# Patient Record
Sex: Female | Born: 1935 | Race: White | Hispanic: No | Marital: Married | Smoking: Never smoker
Health system: Southern US, Community
[De-identification: ages and names within clinical notes are randomized; demographics above are authoritative.]

## PROBLEM LIST (undated history)

## (undated) DIAGNOSIS — I10 Essential (primary) hypertension: Secondary | ICD-10-CM

## (undated) DIAGNOSIS — T7840XA Allergy, unspecified, initial encounter: Secondary | ICD-10-CM

## (undated) DIAGNOSIS — M1990 Unspecified osteoarthritis, unspecified site: Secondary | ICD-10-CM

## (undated) DIAGNOSIS — A048 Other specified bacterial intestinal infections: Secondary | ICD-10-CM

## (undated) DIAGNOSIS — G4730 Sleep apnea, unspecified: Secondary | ICD-10-CM

## (undated) HISTORY — PX: TOTAL HIP ARTHROPLASTY: SHX124

## (undated) HISTORY — DX: Unspecified osteoarthritis, unspecified site: M19.90

## (undated) HISTORY — PX: TUBAL LIGATION: SHX77

## (undated) HISTORY — DX: Other specified bacterial intestinal infections: A04.8

## (undated) HISTORY — DX: Allergy, unspecified, initial encounter: T78.40XA

## (undated) HISTORY — DX: Sleep apnea, unspecified: G47.30

## (undated) HISTORY — DX: Essential (primary) hypertension: I10

## (undated) HISTORY — PX: OTHER SURGICAL HISTORY: SHX169

---

## 1999-02-11 ENCOUNTER — Encounter: Payer: Self-pay | Admitting: Family Medicine

## 1999-02-11 LAB — CONVERTED CEMR LAB

## 2004-10-18 ENCOUNTER — Ambulatory Visit: Payer: Self-pay | Admitting: Family Medicine

## 2005-01-18 ENCOUNTER — Ambulatory Visit: Payer: Self-pay | Admitting: Family Medicine

## 2005-01-28 ENCOUNTER — Ambulatory Visit: Payer: Self-pay | Admitting: Family Medicine

## 2005-02-21 ENCOUNTER — Ambulatory Visit: Payer: Self-pay | Admitting: Family Medicine

## 2005-03-30 ENCOUNTER — Ambulatory Visit: Payer: Self-pay | Admitting: Family Medicine

## 2005-05-05 LAB — HM COLONOSCOPY: HM Colonoscopy: NORMAL

## 2005-07-13 ENCOUNTER — Ambulatory Visit: Payer: Self-pay | Admitting: Family Medicine

## 2005-10-12 ENCOUNTER — Ambulatory Visit: Payer: Self-pay | Admitting: Family Medicine

## 2005-11-15 DIAGNOSIS — M810 Age-related osteoporosis without current pathological fracture: Secondary | ICD-10-CM | POA: Insufficient documentation

## 2005-11-15 DIAGNOSIS — B009 Herpesviral infection, unspecified: Secondary | ICD-10-CM | POA: Insufficient documentation

## 2005-11-15 DIAGNOSIS — H269 Unspecified cataract: Secondary | ICD-10-CM

## 2005-11-15 DIAGNOSIS — M1990 Unspecified osteoarthritis, unspecified site: Secondary | ICD-10-CM

## 2005-11-15 DIAGNOSIS — K5732 Diverticulitis of large intestine without perforation or abscess without bleeding: Secondary | ICD-10-CM | POA: Insufficient documentation

## 2005-11-15 DIAGNOSIS — K279 Peptic ulcer, site unspecified, unspecified as acute or chronic, without hemorrhage or perforation: Secondary | ICD-10-CM | POA: Insufficient documentation

## 2005-11-15 DIAGNOSIS — I10 Essential (primary) hypertension: Secondary | ICD-10-CM

## 2005-12-19 ENCOUNTER — Encounter: Payer: Self-pay | Admitting: Family Medicine

## 2006-01-03 ENCOUNTER — Ambulatory Visit: Payer: Self-pay | Admitting: Family Medicine

## 2006-03-31 ENCOUNTER — Encounter: Payer: Self-pay | Admitting: Family Medicine

## 2006-04-03 ENCOUNTER — Ambulatory Visit: Payer: Self-pay | Admitting: Family Medicine

## 2006-04-11 ENCOUNTER — Encounter: Payer: Self-pay | Admitting: Family Medicine

## 2006-04-11 LAB — CONVERTED CEMR LAB
Cholesterol: 223 mg/dL — ABNORMAL HIGH (ref 0–200)
LDL Cholesterol: 136 mg/dL — ABNORMAL HIGH (ref 0–99)
TSH: 1.264 microintl units/mL (ref 0.350–5.50)

## 2006-04-25 ENCOUNTER — Ambulatory Visit: Payer: Self-pay | Admitting: Family Medicine

## 2006-04-26 ENCOUNTER — Telehealth: Payer: Self-pay | Admitting: Family Medicine

## 2006-05-25 ENCOUNTER — Ambulatory Visit: Payer: Self-pay | Admitting: Family Medicine

## 2006-09-20 ENCOUNTER — Ambulatory Visit: Payer: Self-pay | Admitting: Family Medicine

## 2006-09-20 LAB — CONVERTED CEMR LAB
ALT: 15 units/L (ref 0–35)
AST: 17 units/L (ref 0–37)
Alkaline Phosphatase: 71 units/L (ref 39–117)
Calcium: 9.9 mg/dL (ref 8.4–10.5)
Potassium: 4.7 meq/L (ref 3.5–5.3)
Sodium: 137 meq/L (ref 135–145)
Total Bilirubin: 0.5 mg/dL (ref 0.3–1.2)

## 2006-09-21 ENCOUNTER — Telehealth (INDEPENDENT_AMBULATORY_CARE_PROVIDER_SITE_OTHER): Payer: Self-pay | Admitting: *Deleted

## 2007-01-02 ENCOUNTER — Telehealth: Payer: Self-pay | Admitting: Family Medicine

## 2007-01-19 ENCOUNTER — Ambulatory Visit: Payer: Self-pay | Admitting: Family Medicine

## 2007-02-22 LAB — HM MAMMOGRAPHY

## 2007-02-26 ENCOUNTER — Encounter: Payer: Self-pay | Admitting: Family Medicine

## 2007-06-04 ENCOUNTER — Ambulatory Visit: Payer: Self-pay | Admitting: Family Medicine

## 2007-06-04 LAB — CONVERTED CEMR LAB
Bilirubin Urine: NEGATIVE
Blood in Urine, dipstick: NEGATIVE
Specific Gravity, Urine: 1.015
Urobilinogen, UA: 0.2
pH: 7

## 2007-06-08 ENCOUNTER — Encounter: Payer: Self-pay | Admitting: Family Medicine

## 2007-06-08 LAB — CONVERTED CEMR LAB
Cholesterol: 209 mg/dL — ABNORMAL HIGH (ref 0–200)
HDL: 56 mg/dL (ref >39)
Total CHOL/HDL Ratio: 3.7
Triglycerides: 155 mg/dL — ABNORMAL HIGH (ref <150)
VLDL: 31 mg/dL (ref 0–40)

## 2007-06-11 ENCOUNTER — Encounter: Payer: Self-pay | Admitting: Family Medicine

## 2007-11-15 ENCOUNTER — Ambulatory Visit: Payer: Self-pay | Admitting: Family Medicine

## 2007-12-05 ENCOUNTER — Ambulatory Visit: Payer: Self-pay | Admitting: Family Medicine

## 2007-12-27 ENCOUNTER — Encounter: Payer: Self-pay | Admitting: Family Medicine

## 2007-12-27 LAB — CONVERTED CEMR LAB
BUN: 18 mg/dL
Sodium: 137 meq/L

## 2007-12-28 ENCOUNTER — Encounter: Payer: Self-pay | Admitting: Family Medicine

## 2008-01-21 ENCOUNTER — Encounter: Payer: Self-pay | Admitting: Family Medicine

## 2008-03-07 ENCOUNTER — Encounter: Admission: RE | Admit: 2008-03-07 | Discharge: 2008-03-07 | Payer: Self-pay | Admitting: Family Medicine

## 2008-03-07 ENCOUNTER — Ambulatory Visit: Payer: Self-pay | Admitting: Family Medicine

## 2008-03-07 DIAGNOSIS — I517 Cardiomegaly: Secondary | ICD-10-CM

## 2008-03-07 DIAGNOSIS — M1710 Unilateral primary osteoarthritis, unspecified knee: Secondary | ICD-10-CM

## 2008-03-13 ENCOUNTER — Encounter: Payer: Self-pay | Admitting: Family Medicine

## 2008-03-17 ENCOUNTER — Telehealth: Payer: Self-pay | Admitting: Family Medicine

## 2008-04-02 ENCOUNTER — Telehealth (INDEPENDENT_AMBULATORY_CARE_PROVIDER_SITE_OTHER): Payer: Self-pay | Admitting: *Deleted

## 2008-04-10 ENCOUNTER — Encounter: Payer: Self-pay | Admitting: Family Medicine

## 2008-04-28 ENCOUNTER — Ambulatory Visit: Payer: Self-pay | Admitting: Family Medicine

## 2008-04-28 DIAGNOSIS — B029 Zoster without complications: Secondary | ICD-10-CM | POA: Insufficient documentation

## 2008-05-22 ENCOUNTER — Encounter: Payer: Self-pay | Admitting: Family Medicine

## 2008-07-04 ENCOUNTER — Ambulatory Visit: Payer: Self-pay | Admitting: Family Medicine

## 2008-07-05 ENCOUNTER — Encounter: Payer: Self-pay | Admitting: Family Medicine

## 2008-07-09 ENCOUNTER — Telehealth: Payer: Self-pay | Admitting: Family Medicine

## 2008-07-09 LAB — CONVERTED CEMR LAB
AST: 15 units/L (ref 0–37)
Alkaline Phosphatase: 71 units/L (ref 39–117)
BUN: 12 mg/dL (ref 6–23)
CO2: 26 meq/L (ref 19–32)
Calcium: 9.8 mg/dL (ref 8.4–10.5)
Creatinine, Ser: 0.7 mg/dL (ref 0.40–1.20)
Glucose, Bld: 85 mg/dL (ref 70–99)
HDL: 60 mg/dL (ref >39)
Hemoglobin: 14.9 g/dL (ref 12.0–15.0)
LDL Cholesterol: 122 mg/dL — ABNORMAL HIGH (ref 0–99)
Platelets: 296 10*3/uL (ref 150–400)
Potassium: 4.4 meq/L (ref 3.5–5.3)
Total Bilirubin: 0.6 mg/dL (ref 0.3–1.2)
Total CHOL/HDL Ratio: 3.4
VLDL: 23 mg/dL (ref 0–40)
WBC: 9.7 10*3/uL (ref 4.0–10.5)

## 2008-07-15 ENCOUNTER — Encounter: Payer: Self-pay | Admitting: Family Medicine

## 2008-07-28 ENCOUNTER — Ambulatory Visit: Payer: Self-pay | Admitting: Family Medicine

## 2008-08-06 ENCOUNTER — Telehealth: Payer: Self-pay | Admitting: Family Medicine

## 2008-08-26 ENCOUNTER — Encounter: Payer: Self-pay | Admitting: Family Medicine

## 2008-10-02 ENCOUNTER — Ambulatory Visit: Payer: Self-pay | Admitting: Family Medicine

## 2008-10-02 DIAGNOSIS — J309 Allergic rhinitis, unspecified: Secondary | ICD-10-CM | POA: Insufficient documentation

## 2008-11-12 ENCOUNTER — Telehealth: Payer: Self-pay | Admitting: Family Medicine

## 2008-12-03 ENCOUNTER — Ambulatory Visit: Payer: Self-pay | Admitting: Family Medicine

## 2008-12-03 DIAGNOSIS — K219 Gastro-esophageal reflux disease without esophagitis: Secondary | ICD-10-CM | POA: Insufficient documentation

## 2009-01-20 ENCOUNTER — Encounter: Payer: Self-pay | Admitting: Family Medicine

## 2009-01-20 LAB — CONVERTED CEMR LAB: Hgb A1c MFr Bld: 5.6 %

## 2009-02-23 ENCOUNTER — Telehealth: Payer: Self-pay | Admitting: Family Medicine

## 2009-02-23 DIAGNOSIS — M722 Plantar fascial fibromatosis: Secondary | ICD-10-CM | POA: Insufficient documentation

## 2009-03-05 ENCOUNTER — Encounter
Admission: RE | Admit: 2009-03-05 | Discharge: 2009-06-03 | Payer: Self-pay | Source: Home / Self Care | Admitting: Family Medicine

## 2009-03-05 ENCOUNTER — Encounter: Payer: Self-pay | Admitting: Family Medicine

## 2009-03-23 ENCOUNTER — Encounter: Payer: Self-pay | Admitting: Family Medicine

## 2009-04-06 ENCOUNTER — Ambulatory Visit: Payer: Self-pay | Admitting: Family Medicine

## 2009-04-16 ENCOUNTER — Ambulatory Visit: Payer: Self-pay | Admitting: Family Medicine

## 2009-04-16 ENCOUNTER — Encounter: Admission: RE | Admit: 2009-04-16 | Discharge: 2009-04-16 | Payer: Self-pay | Admitting: Family Medicine

## 2009-04-16 DIAGNOSIS — M79609 Pain in unspecified limb: Secondary | ICD-10-CM | POA: Insufficient documentation

## 2009-04-17 ENCOUNTER — Telehealth (INDEPENDENT_AMBULATORY_CARE_PROVIDER_SITE_OTHER): Payer: Self-pay | Admitting: *Deleted

## 2009-05-01 ENCOUNTER — Telehealth (INDEPENDENT_AMBULATORY_CARE_PROVIDER_SITE_OTHER): Payer: Self-pay | Admitting: *Deleted

## 2009-05-15 ENCOUNTER — Encounter: Payer: Self-pay | Admitting: Family Medicine

## 2009-08-12 ENCOUNTER — Ambulatory Visit: Payer: Self-pay | Admitting: Family Medicine

## 2009-08-12 DIAGNOSIS — R51 Headache: Secondary | ICD-10-CM

## 2009-08-12 DIAGNOSIS — R519 Headache, unspecified: Secondary | ICD-10-CM | POA: Insufficient documentation

## 2009-08-12 DIAGNOSIS — G43109 Migraine with aura, not intractable, without status migrainosus: Secondary | ICD-10-CM | POA: Insufficient documentation

## 2009-08-14 ENCOUNTER — Encounter: Payer: Self-pay | Admitting: Family Medicine

## 2009-08-17 LAB — CONVERTED CEMR LAB
Albumin: 4.6 g/dL (ref 3.5–5.2)
Alkaline Phosphatase: 78 units/L (ref 39–117)
Basophils Absolute: 0.1 10*3/uL (ref 0.0–0.1)
Basophils Relative: 1 % (ref 0–1)
CO2: 25 meq/L (ref 19–32)
Calcium: 9.4 mg/dL (ref 8.4–10.5)
Chloride: 102 meq/L (ref 96–112)
Eosinophils Absolute: 0.1 10*3/uL (ref 0.0–0.7)
Eosinophils Relative: 1 % (ref 0–5)
HDL: 58 mg/dL (ref >39)
Hemoglobin: 14.7 g/dL (ref 12.0–15.0)
LDL Cholesterol: 111 mg/dL — ABNORMAL HIGH (ref 0–99)
Monocytes Relative: 9 % (ref 3–12)
Neutro Abs: 4.6 10*3/uL (ref 1.7–7.7)
Neutrophils Relative %: 53 % (ref 43–77)
Potassium: 4.6 meq/L (ref 3.5–5.3)
RBC: 5.03 M/uL (ref 3.87–5.11)

## 2009-08-20 ENCOUNTER — Encounter: Payer: Self-pay | Admitting: Family Medicine

## 2009-08-21 ENCOUNTER — Telehealth: Payer: Self-pay | Admitting: Family Medicine

## 2009-08-23 ENCOUNTER — Telehealth: Payer: Self-pay | Admitting: Family Medicine

## 2009-09-07 ENCOUNTER — Ambulatory Visit: Payer: Self-pay | Admitting: Family Medicine

## 2009-09-07 DIAGNOSIS — D4959 Neoplasm of unspecified behavior of other genitourinary organ: Secondary | ICD-10-CM

## 2009-12-01 ENCOUNTER — Ambulatory Visit: Payer: Self-pay | Admitting: Family Medicine

## 2010-01-07 ENCOUNTER — Encounter: Payer: Self-pay | Admitting: Family Medicine

## 2010-01-12 ENCOUNTER — Ambulatory Visit: Payer: Self-pay | Admitting: Family Medicine

## 2010-03-09 NOTE — Assessment & Plan Note (Signed)
Summary: DJD R knee   Vital Signs:  Patient profile:   77 year old female Height:      58.5 inches Weight:      139 pounds BMI:     28.66 O2 Sat:      98 % on Room air Pulse rate:   87 / minute BP sitting:   122 / 72  (left arm) Cuff size:   regular  Vitals Entered By: Payton Spark CMA (April 16, 2009 1:47 PM)  O2 Flow:  Room air CC: R knee pain x 5 days. No known injury.  Pain Assessment Patient in pain? yes     Location: R knee Intensity: 10   Primary Care Provider:  Seymour Bars DO  CC:  R knee pain x 5 days. No known injury. Marland Kitchen  History of Present Illness: Ms. Storey is a 77 year old woman with h/o osteoarthritis presenting with R knee pain. On Saturday she was sitting down in a chair and when she stood up she felt her knee "lock." She did not fall but had intense aching pain on the posterior aspect of her knee that radiated to her bilateral posterior calf muscles. The pain improved over the weekend with icyhot, advil, ibuprofen, and rest. Last night when she was getting up from the table the same thing happened again. She is now in a lot of pain and is having trouble walking. It is not tender to the touch but is worse with movement. No swelling, bruising, redness.   She was scheduled for L knee replacement surgery in January but  rescheduled due to plantar fasciitis in her R foot. She finished PT a few weeks ago and had not had any problems.     Allergies: 1)  ! Keflex 2)  Miacalcin  Past History:  Past Medical History: Reviewed history from 04/06/2009 and no changes required. [id number]- NSVD  hay fever Spring/Fall  hx H. Pylori- treated mild sleep apnea- no mask prescribed HTN knee DJD  Social History: Reviewed history from 11/15/2005 and no changes required. 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  Has 2 children; son in [place], daughter in [place].  Non-smoker.  Does low-impact aerobics  Review of Systems      See  HPI  Physical Exam  General:  Pleasant female in no acute distress.  Head:  Normocephalic and atraumatic.  Eyes:  Sclera clear.  Nose:  No rhinorrhea.  Mouth:  Oropharynx clear with no lesions or exudate.  Neck:  Supple.  Lungs:  Clear to auscultation bilaterally. Normal work of breathing.  Heart:  Regular rate and rhythm with normal S1 and S2. No murmur, rub, or gallop.  Pulses:  2+ radial and dorsalis pedis pulses bilaterally. Normal capillary refill.  Neurologic:  Sensation intact bilaterally.  Psych:  Interacting appropriately with normal concentration and attention.    Knee Exam  General:    obese.    Gait:    limp noted-right.    Skin:    Intact, no scars, lesions, rashes, cafe au lait spots, or bruising.    Inspection:     No deformity, ecchymosis or swelling.   Palpation:    Non-tender to palpation over medial joint line, lateral joint line, parapatellar, condylar, patellar tendon, or Pes bursa.   Knee Exam:    Right:    Inspection:  Normal       Location:  R bakers cyst    Stability:  stable  Tenderness:  no    Swelling:  no    Erythema:  no    Nontender to palpation, pain with movement. Popliteal pain radiating to bilateral dorsiflexors with knee flexion and ankle dorsiflexion.     Range of Motion:       Flexion-Active: full       Extension-Active: full       Flexion-Passive: full       Extension-Passive: full  Special tests:    McMurray negative Varus/Valgus negative   Patellofemoral joint crepitus:    Right negative Lachman :    Right negative MCL:    Right negative LCL:    Right negative   Impression & Recommendations:  Problem # 1:  DEGENERATIVE JOINT DISEASE, KNEE (ICD-715.96) Xray today confirms severe DJD in the R knee (also on the L side).  Treat with RX Etodolac for pain and inflammation.  Has a walker for her limp.  D Dimer neg today indicating no DVT (given popliteal pain).  Use ACE wrap and ice and set up with ortho to discuss  steroid injection and knee replacement.    Orders: Ace Wraps 3-5 in/yard  (E4540) Orthopedic Referral (Ortho)  Her updated medication list for this problem includes:    Aspir-low 81 Mg Tbec (Aspirin) .Marland Kitchen... 1 tab daily    Etodolac 400 Mg Tabs (Etodolac) .Marland Kitchen... 1 tab by mouth two times a day with food for knee pain  Complete Medication List: 1)  Aspir-low 81 Mg Tbec (Aspirin) .Marland Kitchen.. 1 tab daily 2)  Calcarb 600/d 600-125 Mg-unit Tabs (Calcium-vitamin d) .Marland Kitchen.. 1 tab two times a day with food 3)  Amlodipine Besy-benazepril Hcl 5-20 Mg Caps (Amlodipine besy-benazepril hcl) .Marland Kitchen.. 1 tab by mouth daily 4)  Multivitamins Tabs (Multiple vitamin) .Marland Kitchen.. 1 tab by mouth daily 5)  Alprazolam 0.25 Mg Tabs (Alprazolam) .Marland Kitchen.. 1 tab by mouth once daily as needed for anxiety 6)  Zantac 150 Mg Caps (Ranitidine hcl) .... Take 1 tablet by mouth once a day 7)  Etodolac 400 Mg Tabs (Etodolac) .Marland Kitchen.. 1 tab by mouth two times a day with food for knee pain  Other Orders: T-D-Dimer Fibrin Derivatives Quantitive ([id number]) T-DG Knee 2 Views*R* ([id number])  Patient Instructions: 1)  Wear ACE wrap and use ice 15 min 4 x a day for comfort. 2)  Use Ibuprofen 600 mg with meals (3 x a day) for pain and swelling. 3)  Blood test today. 4)  Xray today. 5)  Will call you w/ results in the morning. Prescriptions: ETODOLAC 400 MG TABS (ETODOLAC) 1 tab by mouth two times a day with food for knee pain  #24 x 0   Entered and Authorized by:   Seymour Bars DO   Signed by:   Seymour Bars DO on 04/16/2009   Method used:   Electronically to        [facility] [phone]* (retail)       [address] [place], [place]  [address]       Ph: [phone]       Fax: [phone]   RxID:   [id number]

## 2010-03-09 NOTE — Letter (Signed)
Summary: [facility]  [facility]   Imported By: Lanelle Bal 06/25/2009 07:48:49  _____________________________________________________________________  External Attachment:    Type:   Image     Comment:   External Document

## 2010-03-09 NOTE — Assessment & Plan Note (Signed)
Summary: f/u BP   Vital Signs:  Patient profile:   77 year old female Height:      58.5 inches Weight:      135 pounds BMI:     27.84 O2 Sat:      97 % on Room air Pulse rate:   73 / minute BP sitting:   145 / 81  (left arm) Cuff size:   regular  Vitals Entered By: Payton Spark CMA (January 12, 2010 2:06 PM)  O2 Flow:  Room air CC: F/U.    Primary Care Provider:  Seymour Bars DO  CC:  F/U. Marland Kitchen  History of Present Illness: 77 yo WF presents for f/u HTN.  Doing well but she is either forgetting or cutting in half her Atenolol RX.  She has felt a little lightheaded in the afternoons but is not checking her BPs.  eAting well.  Denies CP or DOE.  Denies palpitations or leg swelling.  Her labs are UTD.      Allergies: 1)  ! Keflex 2)  Miacalcin  Past History:  Past Medical History: Reviewed history from 09/07/2009 and no changes required. [id number]- NSVD  hay fever Spring/Fall  hx H. Pylori- treated mild sleep apnea- no mask prescribed HTN knee DJD-- Dr Jorge Mandril  Social History: Reviewed history from 11/15/2005 and no changes required. 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  Has 2 children; son in [place], daughter in [place].  Non-smoker.  Does low-impact aerobics  Review of Systems      See HPI  Physical Exam  General:  alert, well-developed, well-nourished, well-hydrated, and overweight-appearing.   Head:  normocephalic and atraumatic.   Eyes:  pupils equal, pupils round, and pupils reactive to light.   Mouth:  pharynx pink and moist.   Neck:  no masses.  no audible carotid bruits Lungs:  Normal respiratory effort, chest expands symmetrically. Lungs are clear to auscultation, no crackles or wheezes. Heart:  Normal rate and regular rhythm. S1 and S2 normal without gallop, murmur, click, rub or other extra sounds. Pulses:  2+ radial pulses Extremities:  no LE edema Skin:  color normal.   Psych:  good eye contact, not anxious  appearing, and not depressed appearing.     Impression & Recommendations:  Problem # 1:  HYPERTENSION, BENIGN SYSTEMIC (ICD-401.1) BP high today.  Will increase Atenolol from 25--> 50 mg once daily.  Labs UTD. Her updated medication list for this problem includes:    Amlodipine Besy-benazepril Hcl 5-20 Mg Caps (Amlodipine besy-benazepril hcl) .Marland Kitchen... 1 tab by mouth daily    Atenolol 50 Mg Tabs (Atenolol) .Marland Kitchen... 1 tab by mouth daily  BP today: 145/81 Prior BP: 139/80 (09/07/2009)  Prior 10 Yr Risk Heart Disease: 7 % (07/28/2008)  Labs Reviewed: K+: 4.6 (08/14/2009) Creat: : 0.60 (08/14/2009)   Chol: 198 (08/14/2009)   HDL: 58 (08/14/2009)   LDL: 111 (08/14/2009)   TG: 147 (08/14/2009)  Complete Medication List: 1)  Aspir-low 81 Mg Tbec (Aspirin) .Marland Kitchen.. 1 tab daily 2)  Calcarb 600/d 600-125 Mg-unit Tabs (Calcium-vitamin d) .Marland Kitchen.. 1 tab two times a day with food 3)  Amlodipine Besy-benazepril Hcl 5-20 Mg Caps (Amlodipine besy-benazepril hcl) .Marland Kitchen.. 1 tab by mouth daily 4)  Multivitamins Tabs (Multiple vitamin) .Marland Kitchen.. 1 tab by mouth daily 5)  Alprazolam 0.25 Mg Tabs (Alprazolam) .Marland Kitchen.. 1 tab by mouth once daily as needed for anxiety 6)  Zantac 150 Mg Caps (Ranitidine hcl) .... Take 1  tablet by mouth once a day as needed. 7)  Atenolol 50 Mg Tabs (Atenolol) .Marland Kitchen.. 1 tab by mouth daily  Patient Instructions: 1)  Increase Atenolol to 50 mg once daily. 2)  Monitor BPs at home.   3)  If < 100/70, go ahead and cut them in half and take 1/2 tab once daily. 4)  Call if any problems. 5)  REturn for follow up in 6 mos. Prescriptions: ATENOLOL 50 MG TABS (ATENOLOL) 1 tab by mouth daily  #30 x 3   Entered and Authorized by:   Seymour Bars DO   Signed by:   Seymour Bars DO on 01/12/2010   Method used:   Electronically to        [facility] [phone]* (retail)       [address] [place], [place]  [address]       Ph: [phone]       Fax: [phone]   RxID:   [id number]    Orders Added: 1)   Est. Patient Level III [24401]

## 2010-03-09 NOTE — Progress Notes (Signed)
Summary: MRI brain normal  Phone Note Outgoing Call   Summary of Call: Pls let pt know that her brain MRI came back normal.   Initial call taken by: Seymour Bars DO,  August 21, 2009 3:44 PM     Appended Document: MRI brain normal Pt aware of the above

## 2010-03-09 NOTE — Letter (Signed)
Summary: Annual Exam/[facility]  Annual Exam/[facility]   Imported By: Maryln Gottron 01/15/2010 10:36:13  _____________________________________________________________________  External Attachment:    Type:   Image     Comment:   External Document

## 2010-03-09 NOTE — Assessment & Plan Note (Signed)
Summary: f/u HTN   Vital Signs:  Patient profile:   77 year old female Height:      58.5 inches Weight:      139 pounds BMI:     28.66 O2 Sat:      99 % on Room air Temp:     98.3 degrees F oral Pulse rate:   83 / minute BP sitting:   122 / 73  (left arm) Cuff size:   regular  Vitals Entered By: Payton Spark CMA (April 06, 2009 1:48 PM)  O2 Flow:  Room air CC: F/U   Primary Care Provider:  Seymour Bars DO  CC:  F/U.  History of Present Illness: Shelly Page presents for f/u HTN and esophageal reflux.  Last visit 4 mos ago, she was put on Prevacid for 4 wks for throat irritation which has since improved.  She backed down to Zantac which she is just taking as needed now.    Her BP has been well controlled with Amlodopine- Benazepril.  She is doing well on it.  She is limited with exercise due to deconditioning.  She was scheduled for a TKR but then had plantar fasciitis.  This has improved.  She is looking into options other than TKR.  Her L knee is worse than the R.  This limits her ability to walk much and exercise.  She denies the need for a cane.     Current Medications (verified): 1)  Aspir-Low 81 Mg Tbec (Aspirin) .Marland Kitchen.. 1 Tab Daily 2)  Calcarb 600/d 600-125 Mg-Unit Tabs (Calcium-Vitamin D) .Marland Kitchen.. 1 Tab Two Times A Day With Food 3)  Amlodipine Besy-Benazepril Hcl 5-20 Mg Caps (Amlodipine Besy-Benazepril Hcl) .Marland Kitchen.. 1 Tab By Mouth Daily 4)  Multivitamins  Tabs (Multiple Vitamin) .Marland Kitchen.. 1 Tab By Mouth Daily 5)  Alprazolam 0.25 Mg  Tabs (Alprazolam) .Marland Kitchen.. 1 Tab By Mouth Once Daily As Needed For Anxiety 6)  Zantac 150 Mg Caps (Ranitidine Hcl) .... Take 1 Tablet By Mouth Once A Day  Allergies (verified): 1)  ! Keflex 2)  Miacalcin  Past History:  Past Medical History: G2P2002- NSVD  hay fever Spring/Fall  hx H. Pylori- treated mild sleep apnea- no mask prescribed HTN knee DJD  Past Surgical History: Reviewed history from 03/07/2008 and no changes required. R hip  replacement  Tubal ligation cataract surgery Right/ Left  Social History: Reviewed history from 11/15/2005 and no changes required. 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  Has 2 children; son in [place], daughter in [place].  Non-smoker.  Does low-impact aerobics  Review of Systems      See HPI  Physical Exam  General:  alert and well-developed.  overwt Head:  normocephalic and atraumatic.   Eyes:  pupils equal, pupils round, and pupils reactive to light.  wearing glasses Nose:  no nasal discharge.   Mouth:  pharynx pink and moist.   Neck:  no masses.   Lungs:  Normal respiratory effort, chest expands symmetrically. Lungs are clear to auscultation, no crackles or wheezes. Heart:  Normal rate and regular rhythm. S1 and S2 normal without gallop, murmur, click, rub or other extra sounds. Msk:  slight L>R knee effusion w/o injection Extremities:  no LE edema Skin:  color normal.   Psych:  good eye contact, not anxious appearing, and not depressed appearing.     Impression & Recommendations:  Problem # 1:  HYPERTENSION, BENIGN SYSTEMIC (ICD-401.1) BP looks great.  F/U  in 4 mos and will update labs then.   Her updated medication list for this problem includes:    Amlodipine Besy-benazepril Hcl 5-20 Mg Caps (Amlodipine besy-benazepril hcl) .Marland Kitchen... 1 tab by mouth daily  BP today: 122/73 Prior BP: 126/75 (12/03/2008)  Prior 10 Yr Risk Heart Disease: 7 % (07/28/2008)  Labs Reviewed: K+: 4.4 (07/05/2008) Creat: : 0.70 (07/05/2008)   Chol: 205 (07/05/2008)   HDL: 60 (07/05/2008)   LDL: 122 (07/05/2008)   TG: 115 (07/05/2008)  Problem # 2:  ESOPHAGEAL REFLUX (ICD-530.81) Assessment: Improved On Zantac as needed now.   Her updated medication list for this problem includes:    Zantac 150 Mg Caps (Ranitidine hcl) .Marland Kitchen... Take 1 tablet by mouth once a day  Problem # 3:  DEGENERATIVE JOINT DISEASE, LEFT KNEE (ICD-715.96) Assessment: Unchanged She has an  orthopedist and has declined steroid injection, synvisc etc.  Was told that TKR was needed to relieve her pain but she is reluctant to proceed.  Declined RX NSAIDs.  We discussed the importance of needing to stay physically active for medical and physical reasons. Her updated medication list for this problem includes:    Aspir-low 81 Mg Tbec (Aspirin) .Marland Kitchen... 1 tab daily  Complete Medication List: 1)  Aspir-low 81 Mg Tbec (Aspirin) .Marland Kitchen.. 1 tab daily 2)  Calcarb 600/d 600-125 Mg-unit Tabs (Calcium-vitamin d) .Marland Kitchen.. 1 tab two times a day with food 3)  Amlodipine Besy-benazepril Hcl 5-20 Mg Caps (Amlodipine besy-benazepril hcl) .Marland Kitchen.. 1 tab by mouth daily 4)  Multivitamins Tabs (Multiple vitamin) .Marland Kitchen.. 1 tab by mouth daily 5)  Alprazolam 0.25 Mg Tabs (Alprazolam) .Marland Kitchen.. 1 tab by mouth once daily as needed for anxiety 6)  Zantac 150 Mg Caps (Ranitidine hcl) .... Take 1 tablet by mouth once a day  Patient Instructions: 1)  Follow up in 4 months for HTN. Prescriptions: AMLODIPINE BESY-BENAZEPRIL HCL 5-20 MG CAPS (AMLODIPINE BESY-BENAZEPRIL HCL) 1 tab by mouth daily  #90 x 2   Entered and Authorized by:   Seymour Bars DO   Signed by:   Seymour Bars DO on 04/06/2009   Method used:   Electronically to        [facility] [phone]* (retail)       [address] [place], [place]  [address]       Ph: [phone]       Fax: [phone]   RxID:   [id number]

## 2010-03-09 NOTE — Assessment & Plan Note (Signed)
Summary: HA f/u   Vital Signs:  Patient profile:   77 year old female Height:      58.5 inches Weight:      137 pounds BMI:     28.25 O2 Sat:      99 % on Room air Pulse rate:   75 / minute BP sitting:   139 / 80  (left arm) Cuff size:   regular  Vitals Entered By: Payton Spark CMA (September 07, 2009 9:43 AM)  O2 Flow:  Room air CC: F/U HA. Doing well. Also c/o lump on vagina x 3 months.    Primary Care Provider:  Seymour Bars DO  CC:  F/U HA. Doing well. Also c/o lump on vagina x 3 months. .  History of Present Illness: 77 yo WF presents for f/u headaches.  She had a workup including labs, MRI brain and carotid dopplers which were all normal.  She has not had a HA in the past 3 wks.  She had her eyes examined in early July.  She thinks it was from being overheated.    She has noticed a lump on her L labia that she first noticed in May when taking a shower.  It does not hurt.  Denies any bleeding or discharge.  It is not changing in size.      Current Medications (verified): 1)  Aspir-Low 81 Mg Tbec (Aspirin) .Marland Kitchen.. 1 Tab Daily 2)  Calcarb 600/d 600-125 Mg-Unit Tabs (Calcium-Vitamin D) .Marland Kitchen.. 1 Tab Two Times A Day With Food 3)  Amlodipine Besy-Benazepril Hcl 5-20 Mg Caps (Amlodipine Besy-Benazepril Hcl) .Marland Kitchen.. 1 Tab By Mouth Daily 4)  Multivitamins  Tabs (Multiple Vitamin) .Marland Kitchen.. 1 Tab By Mouth Daily 5)  Alprazolam 0.25 Mg  Tabs (Alprazolam) .Marland Kitchen.. 1 Tab By Mouth Once Daily As Needed For Anxiety 6)  Zantac 150 Mg Caps (Ranitidine Hcl) .... Take 1 Tablet By Mouth Once A Day As Needed. 7)  Advil 200 Mg Tabs (Ibuprofen) .... As Needed For Headache 8)  Atenolol 25 Mg Tabs (Atenolol) .Marland Kitchen.. 1 Tab By Mouth Daily  Allergies (verified): 1)  ! Keflex 2)  Miacalcin  Past History:  Past Medical History: G2P2002- NSVD  hay fever Spring/Fall  hx H. Pylori- treated mild sleep apnea- no mask prescribed HTN knee DJD-- Dr Jorge Mandril  Past Surgical History: Reviewed history from 03/07/2008 and no  changes required. R hip replacement  Tubal ligation cataract surgery Right/ Left  Social History: Reviewed history from 11/15/2005 and no changes required. 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  Has 2 children; son in [place], daughter in [place].  Non-smoker.  Does low-impact aerobics  Review of Systems      See HPI  Physical Exam  General:  alert, well-developed, well-nourished, well-hydrated, and overweight-appearing.   Head:  normocephalic and atraumatic.   Eyes:  pupils equal, pupils round, and pupils reactive to light.   Mouth:  pharynx pink and moist.   Neck:  no masses.   Lungs:  Normal respiratory effort, chest expands symmetrically. Lungs are clear to auscultation, no crackles or wheezes. Heart:  Regular rate and rhythm with normal S1 and S2. No murmur, rub, or gallop.  Genitalia:  normal introitus, no external lesions, no vaginal discharge, and mucosa pink and moist.   Skin:  color normal.   Inguinal Nodes:  No significant adenopathy Psych:  good eye contact, not anxious appearing, and not depressed appearing.     Impression &  Recommendations:  Problem # 1:  CEPHALGIA (ICD-784.0) Workup done for atypical HAs.  She had normal labs, a normal carotid doppler u/s and a normal MRI of the brain.  This may have been from vision changes, which she had checked last month.  She has stopped having frequent HAs and her BPs have been normal.  They were likely caused by dehydration.  She has been trying to drink more fluids and stay out of the heat. Her updated medication list for this problem includes:    Aspir-low 81 Mg Tbec (Aspirin) .Marland Kitchen... 1 tab daily    Advil 200 Mg Tabs (Ibuprofen) .Marland Kitchen... As needed for headache    Atenolol 25 Mg Tabs (Atenolol) .Marland Kitchen... 1 tab by mouth daily  Problem # 2:  NEOPLASM UNCERTAIN BHV OTH&UNSPEC FE GENIT ORGN (ICD-236.3) I was unable to palpate a labial lesion which she had felt back in May.  No visible skin changes and  no inguinal lymphadenopathy.  REassured pt and asked her to call if any changes.  Complete Medication List: 1)  Aspir-low 81 Mg Tbec (Aspirin) .Marland Kitchen.. 1 tab daily 2)  Calcarb 600/d 600-125 Mg-unit Tabs (Calcium-vitamin d) .Marland Kitchen.. 1 tab two times a day with food 3)  Amlodipine Besy-benazepril Hcl 5-20 Mg Caps (Amlodipine besy-benazepril hcl) .Marland Kitchen.. 1 tab by mouth daily 4)  Multivitamins Tabs (Multiple vitamin) .Marland Kitchen.. 1 tab by mouth daily 5)  Alprazolam 0.25 Mg Tabs (Alprazolam) .Marland Kitchen.. 1 tab by mouth once daily as needed for anxiety 6)  Zantac 150 Mg Caps (Ranitidine hcl) .... Take 1 tablet by mouth once a day as needed. 7)  Advil 200 Mg Tabs (Ibuprofen) .... As needed for headache 8)  Atenolol 25 Mg Tabs (Atenolol) .Marland Kitchen.. 1 tab by mouth daily  Patient Instructions: 1)  Call if anything changes on labia. 2)  BP looks great. 3)  Caroltids, labs and MRI brain all normal. 4)  Make sure you stay hydrated (urine should be pale yellow). 5)  Return for follow up in 4 mos.

## 2010-03-09 NOTE — Progress Notes (Signed)
Summary: CALL A NURSE  Phone Note From Other Clinic   Summary of Call: [facility] Triage Call Report Triage Record Num: [id number] Operator: Craig Guess Patient Name: Shelly Page Call Date & Time: 04/16/2009 5:42:41PM Patient Phone: [phone] PCP: Seymour Bars, DO Patient Gender: Female PCP Fax : [phone] Patient DOB: 02/28/1932 Practice Name: [facility] Reason for Call: Delaney Meigs calling from [facility] [phone] with stat result. D-Dimer Normal at 0.34. Note to office for Friday 3/11 am review per Practice Profile. Protocol(s) Used: PCP Calls, No Triage (Adult) Recommended Outcome per Protocol: Call Provider within 24 Hours Reason for Outcome: Lab calling with test results Care Advice: Initial call taken by: Payton Spark CMA,  April 17, 2009 9:02 AM

## 2010-03-09 NOTE — Assessment & Plan Note (Signed)
Summary: new onset migraines   Vital Signs:  Patient profile:   77 year old female Height:      58.5 inches Weight:      136 pounds BMI:     28.04 Temp:     98.0 degrees F oral Pulse rate:   86 / minute Pulse rhythm:   regular Resp:     16 per minute BP sitting:   145 / 84  (right arm) Cuff size:   regular  Vitals Entered By: Mervin Kung CMA Duncan Dull) (August 12, 2009 1:36 PM) CC: Room 1  4 month follow up. Pt has been having 1 headache accompanied by flashes of light each week. Feels better after resting. Eye doctor told her her vision is good and  may be having migraines? Is Patient Diabetic? No   Primary Care Provider:  Seymour Bars DO  CC:  Room 1  4 month follow up. Pt has been having 1 headache accompanied by flashes of light each week. Feels better after resting. Eye doctor told her her vision is good and  may be having migraines?.  History of Present Illness: 77 yo WF presents for f/u HTN.  She has started having HAs over the R eye with 'squiggly' lines in the R eye when she got overheated.  She had her vision checked 2 wks ago and it was normal.  Her HAs go away with rest.  The HAs last about an hr.  Sometimes takes Advil and it goes away.  Her BPs are running 120-130s / 70s to 80s at home.  Denies any CP or SOB.  Her HAs are always on the R side.    She never had migraines.  Denies feeling confused.  Denies any N/V.  She has distorted vision and photophobia with it.  Denies any tingling, numbness or confusion.    Allergies: 1)  ! Keflex 2)  Miacalcin  Past History:  Past Medical History: Reviewed history from 04/06/2009 and no changes required. [id number]- NSVD  hay fever Spring/Fall  hx H. Pylori- treated mild sleep apnea- no mask prescribed HTN knee DJD  Past Surgical History: Reviewed history from 03/07/2008 and no changes required. R hip replacement  Tubal ligation cataract surgery Right/ Left  Social History: Reviewed history from 11/15/2005 and no  changes required. 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  Has 2 children; son in [place], daughter in [place].  Non-smoker.  Does low-impact aerobics  Review of Systems General:  Denies fatigue, sleep disorder, sweats, and weight loss. Eyes:  R eye scotomata just prior to onset of severe R sided HA. ENT:  Denies difficulty swallowing. CV:  Denies chest pain or discomfort, palpitations, shortness of breath with exertion, and swelling of feet. Resp:  Denies shortness of breath. GI:  Denies loss of appetite, nausea, and vomiting. GU:  Denies incontinence. MS:  Denies muscle weakness. Neuro:  Complains of headaches; denies falling down, inability to speak, memory loss, numbness, poor balance, and tingling.  Physical Exam  General:  alert, well-developed, well-nourished, well-hydrated, and overweight-appearing.   Head:  normocephalic and atraumatic.  no temporal artery bruits Eyes:  s/p bilat cataract surgery.  PERRLA.  EOMI.  grossly normal fundoscopic exam Mouth:  pharynx pink and moist.   Neck:  no masses.  no audible carotid bruits Lungs:  Normal respiratory effort, chest expands symmetrically. Lungs are clear to auscultation, no crackles or wheezes. Heart:  Regular rate and rhythm with normal S1  and S2. No murmur, rub, or gallop.  Msk:  grip + 5/5 bilat  Pulses:  2+ radial pulses Extremities:  no LE edema Neurologic:  strength normal in all extremities and gait normal.   Skin:  color normal.   Cervical Nodes:  No lymphadenopathy noted Psych:  memory intact for recent and remote, good eye contact, not anxious appearing, and not depressed appearing.     Impression & Recommendations:  Problem # 1:  MIGRAINE WITH AURA (ICD-346.00) Assessment New Atypical presentation of migraine with Aura, recurrent in short period of time along with higher BPs than usual and age of 65.  Will need a w/u to look at cerebrovascular circulation.  Will order MRI brain +  carotid dopplers to look.  If anything acutely changes- neuro symptoms, severe HA, go to the ED.   The following medications were removed from the medication list:    Etodolac 400 Mg Tabs (Etodolac) .Marland Kitchen... 1 tab by mouth two times a day with food for knee pain Her updated medication list for this problem includes:    Aspir-low 81 Mg Tbec (Aspirin) .Marland Kitchen... 1 tab daily    Advil 200 Mg Tabs (Ibuprofen) .Marland Kitchen... As needed for headache    Atenolol 25 Mg Tabs (Atenolol) .Marland Kitchen... 1 tab by mouth daily  Orders: T-MRI Head w/o contrast (09811)  Problem # 2:  HYPERTENSION, BENIGN SYSTEMIC (ICD-401.1) BP running high even on recheck today.  Will add ATenolol once a day.  Higher BPs may be predisposing her to HAs.   Her updated medication list for this problem includes:    Amlodipine Besy-benazepril Hcl 5-20 Mg Caps (Amlodipine besy-benazepril hcl) .Marland Kitchen... 1 tab by mouth daily    Atenolol 25 Mg Tabs (Atenolol) .Marland Kitchen... 1 tab by mouth daily  Orders: T-Vascular Study-Carotids Complete (93880)  BP today: 145/84 Prior BP: 122/72 (04/16/2009)  Prior 10 Yr Risk Heart Disease: 7 % (07/28/2008)  Labs Reviewed: K+: 4.4 (07/05/2008) Creat: : 0.70 (07/05/2008)   Chol: 205 (07/05/2008)   HDL: 60 (07/05/2008)   LDL: 122 (07/05/2008)   TG: 115 (07/05/2008)  Complete Medication List: 1)  Aspir-low 81 Mg Tbec (Aspirin) .Marland Kitchen.. 1 tab daily 2)  Calcarb 600/d 600-125 Mg-unit Tabs (Calcium-vitamin d) .Marland Kitchen.. 1 tab two times a day with food 3)  Amlodipine Besy-benazepril Hcl 5-20 Mg Caps (Amlodipine besy-benazepril hcl) .Marland Kitchen.. 1 tab by mouth daily 4)  Multivitamins Tabs (Multiple vitamin) .Marland Kitchen.. 1 tab by mouth daily 5)  Alprazolam 0.25 Mg Tabs (Alprazolam) .Marland Kitchen.. 1 tab by mouth once daily as needed for anxiety 6)  Zantac 150 Mg Caps (Ranitidine hcl) .... Take 1 tablet by mouth once a day as needed. 7)  Advil 200 Mg Tabs (Ibuprofen) .... As needed for headache 8)  Atenolol 25 Mg Tabs (Atenolol) .Marland Kitchen.. 1 tab by mouth daily  Other  Orders: T-CBC w/Diff ([id number]) T-Comprehensive Metabolic Panel [phone]) T-Lipid Profile ([id number])  Patient Instructions: 1)  Update fasting labs one morning downstairs. 2)  Will call you w/ results. 3)  MRI brain + carotid dopplers order together in [place].  jennifer will call to schedule this for you. 4)  Return for follow up headaches in 3 wks. Prescriptions: ATENOLOL 25 MG TABS (ATENOLOL) 1 tab by mouth daily  #30 x 1   Entered and Authorized by:   Seymour Bars DO   Signed by:   Seymour Bars DO on 08/12/2009   Method used:   Electronically to        [facility] [phone]* (retail)  [address] [place], [place]  [address]       Ph: [phone]       Fax: [phone]   RxID:   [id number]   Current Allergies (reviewed today): ! KEFLEX MIACALCIN  Appended Document: new onset migraines Pls let pt know that I decided to add Atenolol 25 mg once a day on board for BP reduction to help reduce risk for stroke.  She can pick this up from her pharmacy.  Continue on all other meds.  Seymour Bars, D.O.  Appended Document: new onset migraines Pt notified.

## 2010-03-09 NOTE — Progress Notes (Signed)
Summary: normal carotid dopplers  Phone Note Outgoing Call   Summary of Call: Pls let pt know that her carotid doppler test came back NORMAL - no blockage.   Initial call taken by: Seymour Bars DO,  August 23, 2009 2:59 PM     Appended Document: normal carotid dopplers 08/24/2009 @ 8:57am-Pt notified of results. KJ LPn

## 2010-03-09 NOTE — Miscellaneous (Signed)
Summary: PT Discharge/[facility]  PT Discharge/[facility]   Imported By: Lanelle Bal 04/03/2009 08:39:29  _____________________________________________________________________  External Attachment:    Type:   Image     Comment:   External Document

## 2010-03-09 NOTE — Assessment & Plan Note (Signed)
Summary: flu shot   Nurse Visit   Vitals Entered By: Payton Spark CMA (December 01, 2009 1:34 PM)  Allergies: 1)  ! Keflex 2)  Miacalcin  Orders Added: 1)  Flu Vaccine 59yrs + MEDICARE PATIENTS [Q2039] 2)  Administration Flu vaccine - MCR [G0008] Flu Vaccine Consent Questions     Do you have a history of severe allergic reactions to this vaccine? no    Any prior history of allergic reactions to egg and/or gelatin? no    Do you have a sensitivity to the preservative Thimersol? no    Do you have a past history of Guillan-Barre Syndrome? no    Do you currently have an acute febrile illness? no    Have you ever had a severe reaction to latex? no    Vaccine information given and explained to patient? yes    Are you currently pregnant? no    Lot Number:[id number]   Exp Date:08/07/2010   Site Given  Left Deltoid IMmedflu

## 2010-03-09 NOTE — Letter (Signed)
Summary: Patient Eval Scheduled/[facility]  Patient [facility]   Imported By: Lanelle Bal 03/11/2009 09:19:19  _____________________________________________________________________  External Attachment:    Type:   Image     Comment:   External Document

## 2010-03-09 NOTE — Progress Notes (Signed)
Summary: Knee pain  Phone Note Call from Patient   Caller: Patient Summary of Call: Pt states her knee is still very painful and has not improved at all. Please advise. Initial call taken by: Payton Spark CMA,  May 01, 2009 11:30 AM  Follow-up for Phone Call        lets get her in with ortho for an injection.  does she already have an orthopedist? Follow-up by: Seymour Bars DO,  May 01, 2009 11:48 AM  Additional Follow-up for Phone Call Additional follow up Details #1::        Pt did see ortho for this problem but wasn't sure if she needed to call them or you. I advised Pt to ortho since she saw them last for this. Pt agreed. Additional Follow-up by: Payton Spark CMA,  May 01, 2009 12:41 PM

## 2010-03-09 NOTE — Progress Notes (Signed)
Summary: PT referral   Phone Note Call from Patient   Caller: Patient Summary of Call: Pt would like referral to PT for plantar fascitis. Please advise.  Initial call taken by: Payton Spark CMA,  February 23, 2009 4:18 PM  New Problems: FASCIITIS, PLANTAR (ICD-728.71)   New Problems: FASCIITIS, PLANTAR (ICD-728.71)  Appended Document: PT referral  To Myriam Jacobson to schedule

## 2010-06-09 ENCOUNTER — Other Ambulatory Visit: Payer: Self-pay | Admitting: Family Medicine

## 2010-07-11 ENCOUNTER — Encounter: Payer: Self-pay | Admitting: Family Medicine

## 2010-07-14 ENCOUNTER — Ambulatory Visit (INDEPENDENT_AMBULATORY_CARE_PROVIDER_SITE_OTHER): Payer: Medicare Other | Admitting: Family Medicine

## 2010-07-14 ENCOUNTER — Encounter: Payer: Self-pay | Admitting: Family Medicine

## 2010-07-14 DIAGNOSIS — M810 Age-related osteoporosis without current pathological fracture: Secondary | ICD-10-CM

## 2010-07-14 DIAGNOSIS — M1990 Unspecified osteoarthritis, unspecified site: Secondary | ICD-10-CM

## 2010-07-14 DIAGNOSIS — E785 Hyperlipidemia, unspecified: Secondary | ICD-10-CM

## 2010-07-14 DIAGNOSIS — I10 Essential (primary) hypertension: Secondary | ICD-10-CM

## 2010-07-14 MED ORDER — ATENOLOL 25 MG PO TABS: ORAL_TABLET | ORAL | Status: DC

## 2010-07-14 NOTE — Assessment & Plan Note (Signed)
Seeing Dr Jorge Mandril and had great response to a steroid injection last year.  Her pain is starting to come back and we discussed treating her arthritis to keep her active and prevent fall risk.

## 2010-07-14 NOTE — Assessment & Plan Note (Signed)
BP at goal today.  Will continue Atenolol + Lotensin.  Lab order printed out.

## 2010-07-14 NOTE — Progress Notes (Signed)
  Subjective:    Patient ID: Shelly Page, female    DOB: 1932/04/18, 77 y.o.   MRN: [id number]  HPI 77 yo WF presents for f/u HTN visit.  She is seeing [facility] for her pap smear.  She decided to not have a mammogram this year.  She is doing well on her meds.  She saw cardiologist in the past year and reports having a normal EKG.  Denies CP or DOE.  Due for fasting labs in 2 mos.  She continues to have some knee pain from DJD, seeing Dr Jorge Mandril.  BP 121/70  Pulse 68  Ht 4\' 11"  (1.499 m)  Wt 135 lb (61.236 kg)  BMI 27.27 kg/m2  SpO2 98%   Review of Systems  Constitutional: Negative for fatigue and unexpected weight change.  Eyes: Negative for visual disturbance.  Respiratory: Negative for shortness of breath.   Cardiovascular: Negative for chest pain, palpitations and leg swelling.  Genitourinary: Negative for difficulty urinating.  Musculoskeletal: Positive for arthralgias. Negative for gait problem.  Neurological: Negative for dizziness and headaches.       Objective:   Physical Exam  Constitutional: She appears well-developed and well-nourished. No distress.       overwt  HENT:  Mouth/Throat: Oropharynx is clear and moist.  Neck: Neck supple. No thyromegaly present.  Cardiovascular: Normal rate, regular rhythm and normal heart sounds.   No murmur heard. Pulmonary/Chest: Effort normal and breath sounds normal.  Musculoskeletal: She exhibits no edema.  Lymphadenopathy:    She has no cervical adenopathy.  Skin: Skin is warm and dry.  Psychiatric: She has a normal mood and affect.          Assessment & Plan:

## 2010-07-14 NOTE — Patient Instructions (Signed)
BP looks perfect.  Stay on current meds.  Stay active.  Work on getting 30 min of exercise 4 days/ wk.  Update fasting labs after 7-8. Will call you w/ results.  Return for f/u in 6 mos.

## 2010-08-26 ENCOUNTER — Telehealth: Payer: Self-pay | Admitting: Family Medicine

## 2010-08-26 LAB — LIPID PANEL
Cholesterol: 212 mg/dL — ABNORMAL HIGH (ref 0–200)
Triglycerides: 108 mg/dL (ref <150)

## 2010-08-26 LAB — CBC WITH DIFFERENTIAL/PLATELET
Basophils Absolute: 0.1 10*3/uL (ref 0.0–0.1)
Basophils Relative: 1 % (ref 0–1)
Eosinophils Absolute: 0.1 10*3/uL (ref 0.0–0.7)
Eosinophils Relative: 1 % (ref 0–5)
Hemoglobin: 14.7 g/dL (ref 12.0–15.0)
MCH: 29.2 pg (ref 26.0–34.0)
MCHC: 32.2 g/dL (ref 30.0–36.0)
Monocytes Absolute: 0.8 10*3/uL (ref 0.1–1.0)
Neutrophils Relative %: 54 % (ref 43–77)
WBC: 8.4 10*3/uL (ref 4.0–10.5)

## 2010-08-26 LAB — COMPLETE METABOLIC PANEL WITH GFR
AST: 15 U/L (ref 0–37)
Albumin: 4.4 g/dL (ref 3.5–5.2)
Alkaline Phosphatase: 73 U/L (ref 39–117)
Chloride: 103 mEq/L (ref 96–112)
Creat: 0.52 mg/dL (ref 0.50–1.10)
GFR, Est Non African American: >60 mL/min (ref >60)
Glucose, Bld: 90 mg/dL (ref 70–99)

## 2010-08-26 NOTE — Telephone Encounter (Signed)
Pls let pt know that her blood counts, fasting sugar and cholesterol look great.  Stay on current meds.  Vit D is a little low.  Increase OTC vitamin D to 2,000 iU/ day.

## 2010-08-26 NOTE — Telephone Encounter (Signed)
LMOM advising pt of results and rec. 

## 2010-10-27 IMAGING — CR DG KNEE 1-2V*R*
2 series · 2 of 2 positions shown · non-contrast
Comparison: None

CLINICAL DATA: Knee pain

RIGHT KNEE - 1-2 VIEW

[view not recorded (1 of 2)]
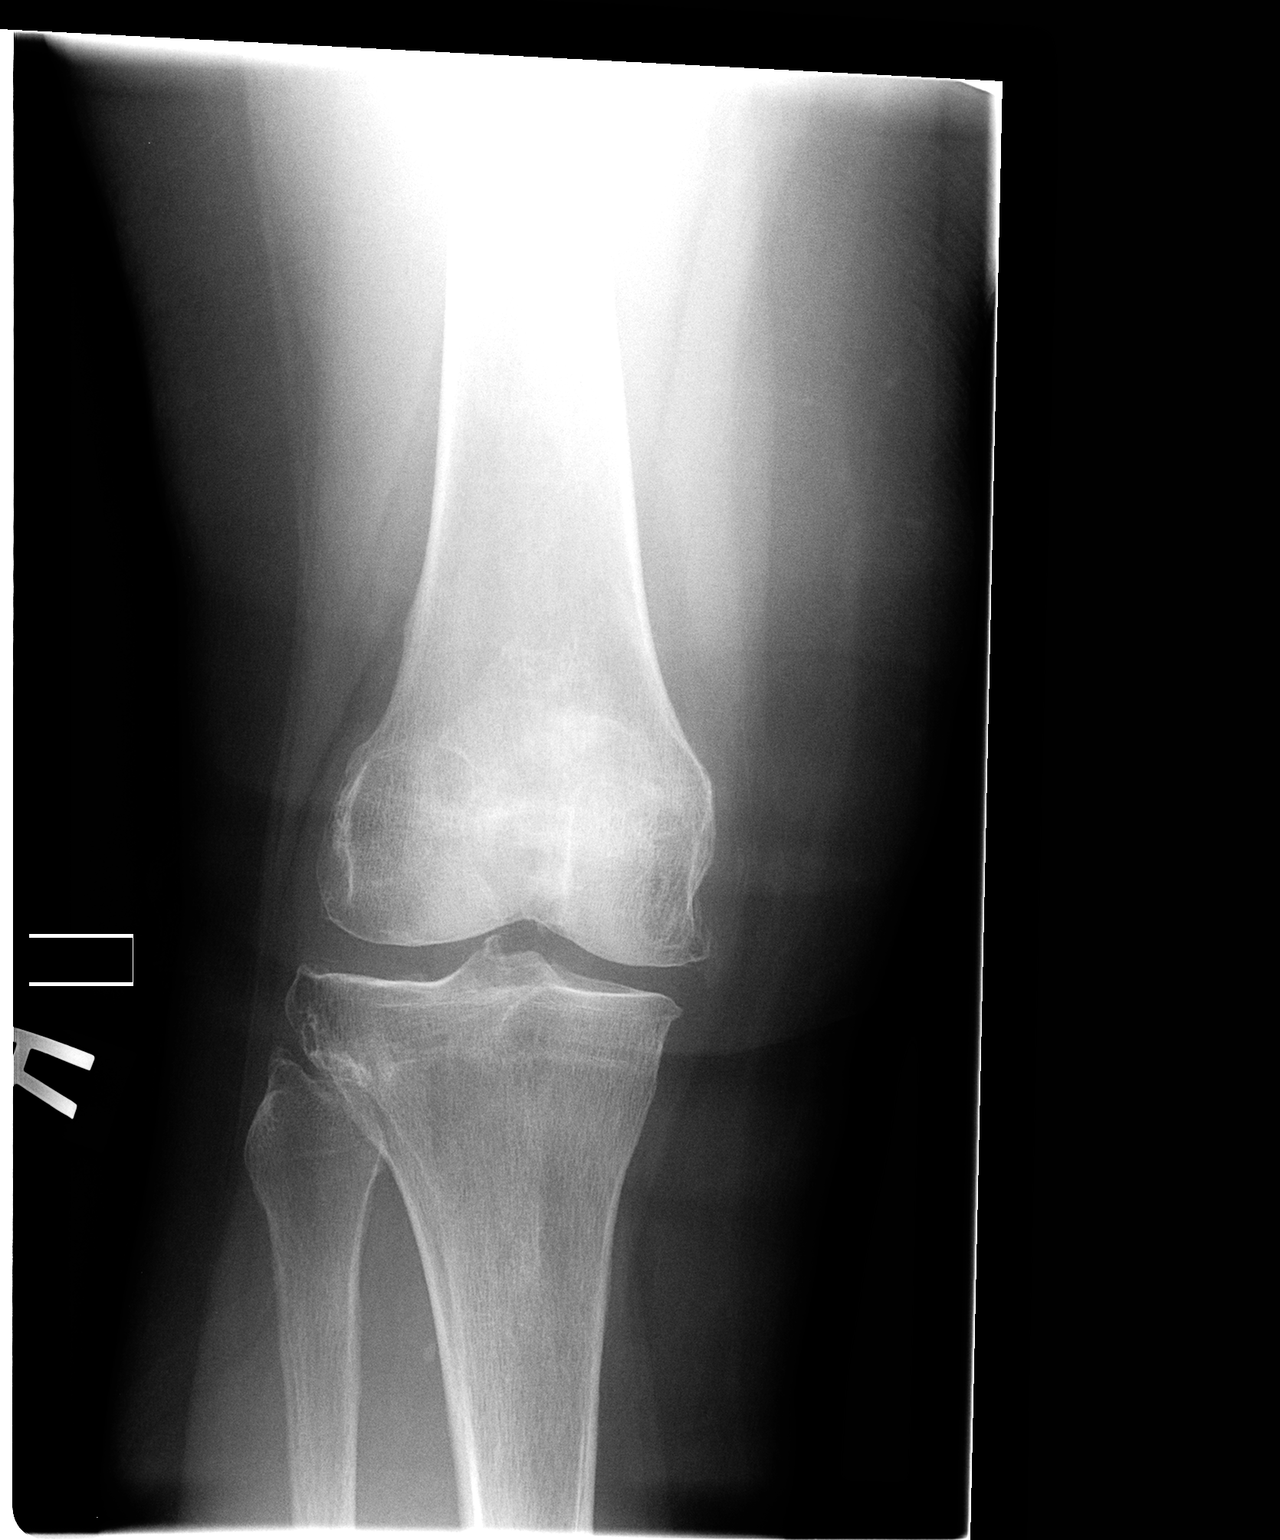

[view not recorded (2 of 2)]
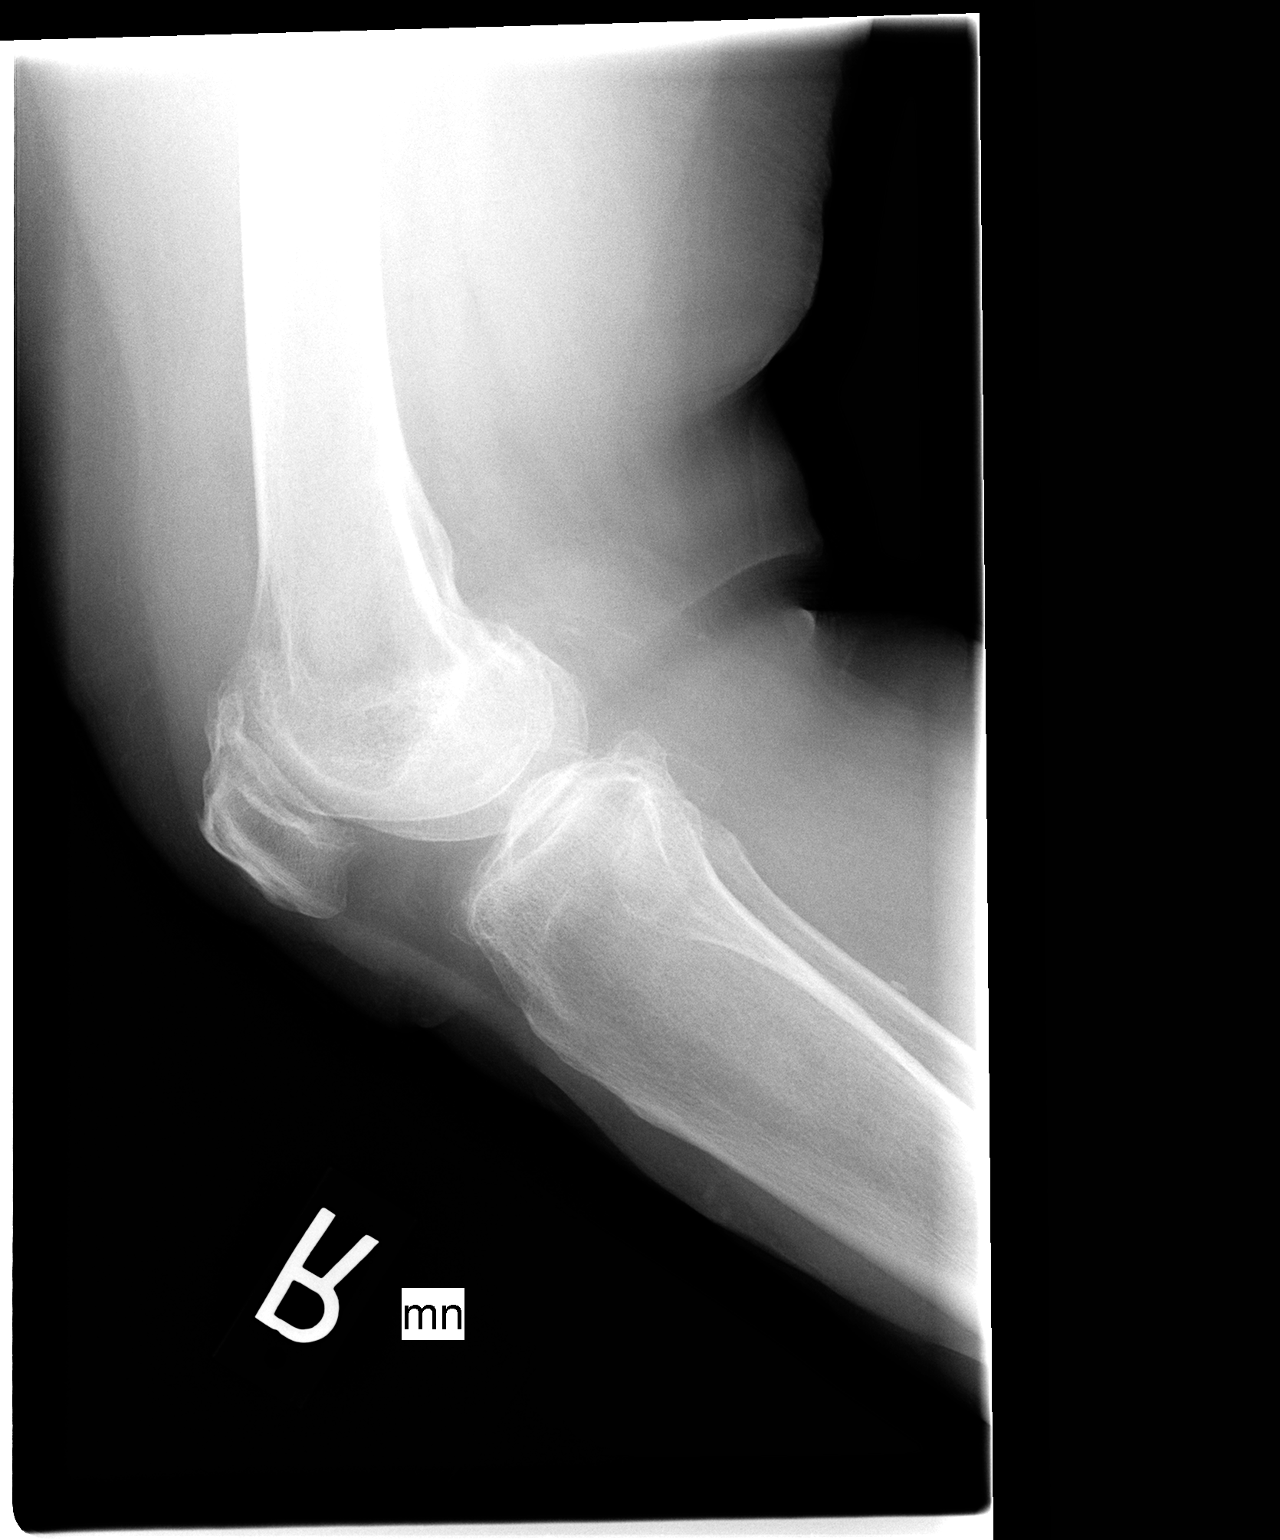

[2 of 2 positions shown; findings below may reference images not displayed]

FINDINGS: There is no joint effusion.

Advanced tricompartment old osteoarthritis is identified.

There is sharpening of the tibial spines, marginal spur formation
and subchondral sclerosis.

No radiopaque foreign bodies or soft tissue calcifications.
IMPRESSION: 1.  Severe degenerative joint disease.

## 2010-10-29 ENCOUNTER — Other Ambulatory Visit: Payer: Self-pay | Admitting: *Deleted

## 2010-10-29 MED ORDER — AMLODIPINE BESY-BENAZEPRIL HCL 5-20 MG PO CAPS: 1.0000 | ORAL_CAPSULE | Freq: Every day | ORAL | Status: DC

## 2010-11-01 ENCOUNTER — Other Ambulatory Visit: Payer: Self-pay | Admitting: Family Medicine

## 2010-11-01 NOTE — Telephone Encounter (Signed)
Pt calling regarding refill of her BP med Lotrel 5-20mg .  [facility] Spaniel it was denied and she took her last pill this am. Plan:  Notified the pt and a RX refill was sent on 10-29-10 #90/1 refill.  SHe is saying as of this am pharm is saying they do not have the refill.  Pharmacy notified and they have the script ready for the pt to pup.  Pt informed. Jarvis Newcomer, LPN [facility]

## 2010-12-20 ENCOUNTER — Ambulatory Visit (INDEPENDENT_AMBULATORY_CARE_PROVIDER_SITE_OTHER): Payer: Medicare Other | Admitting: Family Medicine

## 2010-12-20 ENCOUNTER — Encounter: Payer: Self-pay | Admitting: Family Medicine

## 2010-12-20 VITALS — BP 122/70 | HR 79 | Wt 134.0 lb

## 2010-12-20 DIAGNOSIS — R1903 Right lower quadrant abdominal swelling, mass and lump: Secondary | ICD-10-CM

## 2010-12-20 DIAGNOSIS — Z23 Encounter for immunization: Secondary | ICD-10-CM

## 2010-12-20 NOTE — Patient Instructions (Signed)
We will call you with the ultrasound test.

## 2010-12-20 NOTE — Progress Notes (Signed)
  Subjective:    Patient ID: Shelly Page, female    DOB: [birth year], 77 y.o.   MRN: [id number]  HPI  Lump in the RLQ for 1 mo. Was sore initially. No longer initially. Feels it when bends over to tie her shoes she feels it.  Hx of ovarian cyst years ago. No medication injections. Change in bowels in the last month where stools are changing between constipation or diarrhea.  No fevers.  Last colonosocpy was 5 yrs ago. Told had some diveticulosis.  No nausea or vomiting.  Review of Systems     Objective:   Physical Exam  Constitutional: She appears well-developed and well-nourished.  Abdominal:       She does have a palpable lump in the right lower corner and that is approximately 1 cm in size. It feels to be in the fatty tissue. It is not mobile. It is also not reducible. It is mildly tender on exam. There is no overlying rash or skin redness or swelling.          Assessment & Plan:  Lump on RLQ  - conisder lipoma vs fat necrosis vs lymph node. I really don't think it is a hernia. If the ultrasound is negative then consider further evaluation with a CT. Patient is to keep an eye on the area she feels is getting larger or more tender to please call the office and let us know.

## 2010-12-22 ENCOUNTER — Other Ambulatory Visit: Payer: Self-pay | Admitting: Family Medicine

## 2010-12-22 ENCOUNTER — Ambulatory Visit
Admission: RE | Admit: 2010-12-22 | Discharge: 2010-12-22 | Disposition: A | Discharge status: Home / Self Care | Payer: Medicare Other | Source: Ambulatory Visit | Attending: Family Medicine | Admitting: Family Medicine

## 2010-12-22 DIAGNOSIS — R1903 Right lower quadrant abdominal swelling, mass and lump: Secondary | ICD-10-CM

## 2011-01-19 ENCOUNTER — Encounter: Payer: Self-pay | Admitting: Family Medicine

## 2011-01-19 ENCOUNTER — Ambulatory Visit (INDEPENDENT_AMBULATORY_CARE_PROVIDER_SITE_OTHER): Payer: Medicare Other | Admitting: Family Medicine

## 2011-01-19 VITALS — BP 122/76 | HR 93 | Ht 59.0 in | Wt 136.0 lb

## 2011-01-19 DIAGNOSIS — E559 Vitamin D deficiency, unspecified: Secondary | ICD-10-CM

## 2011-01-19 DIAGNOSIS — I10 Essential (primary) hypertension: Secondary | ICD-10-CM

## 2011-01-19 MED ORDER — AMLODIPINE BESY-BENAZEPRIL HCL 5-20 MG PO CAPS: 1.0000 | ORAL_CAPSULE | Freq: Every day | ORAL | Status: DC

## 2011-01-19 MED ORDER — AMBULATORY NON FORMULARY MEDICATION: Status: DC

## 2011-01-19 NOTE — Progress Notes (Signed)
  Subjective:    Patient ID: Shelly Page, female    DOB: [birth year], 77 y.o.   MRN: [id number]  Hypertension This is a chronic problem. The current episode started more than 1 year ago. The problem is unchanged. Pertinent negatives include no blurred vision, chest pain, palpitations, peripheral edema or shortness of breath. There are no associated agents to hypertension. Past treatments include calcium channel blockers and angiotensin blockers. The current treatment provides moderate improvement. There are no compliance problems.   Occ gets weak speels when BP drops to 110.  She says has only happened a couple of times. She thinks it may be because the last time she got her prescription they gave her a generic. She would like to make sure that I write for brand this time.   Review of Systems  Eyes: Negative for blurred vision.  Respiratory: Negative for shortness of breath.   Cardiovascular: Negative for chest pain and palpitations.       Objective:   Physical Exam  Constitutional: She is oriented to person, place, and time. She appears well-developed and well-nourished.  HENT:  Head: Normocephalic and atraumatic.  Eyes: Conjunctivae are normal. Pupils are equal, round, and reactive to light.  Neck: Neck supple. No thyromegaly present.  Cardiovascular: Normal rate, regular rhythm and normal heart sounds.        No carotid bruit  Pulmonary/Chest: Effort normal and breath sounds normal.  Musculoskeletal: She exhibits no edema.  Lymphadenopathy:    She has no cervical adenopathy.  Neurological: She is alert and oriented to person, place, and time.  Skin: Skin is warm and dry.  Psychiatric: She has a normal mood and affect. Her behavior is normal.          Assessment & Plan:  HTN- At goal. Monitor for lows.  Wil refill meds, brand only. Due for BMP in feb. followup in 6 months  Vit D def - Due ot recheck.   We discussed the importance of getting vaccinated against shingles.  She said she was interested so I gave her prescriptions as she can take it at the pharmacy and it can be processed under her Medicare part D.

## 2011-04-07 LAB — BASIC METABOLIC PANEL WITH GFR
BUN: 13 mg/dL (ref 6–23)
CO2: 24 mEq/L (ref 19–32)
Calcium: 9.5 mg/dL (ref 8.4–10.5)
Chloride: 103 mEq/L (ref 96–112)
Creat: 0.58 mg/dL (ref 0.50–1.10)
GFR, Est African American: >89 mL/min
GFR, Est Non African American: >89 mL/min
Glucose, Bld: 89 mg/dL (ref 70–99)

## 2011-06-08 ENCOUNTER — Telehealth: Payer: Self-pay | Admitting: *Deleted

## 2011-06-08 NOTE — Telephone Encounter (Signed)
Pt is requesting a referral to a surgeon. 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

## 2011-06-08 NOTE — Telephone Encounter (Signed)
No problem.  Will place referral. Is she having a lot of pain?

## 2011-06-09 NOTE — Telephone Encounter (Signed)
Pt states that the pain is usually there everyday in the RLQ off and on. States yesterday it was a constant pain. Says at times it is sharp at times. Rating her pain at 7/10.

## 2011-06-09 NOTE — Telephone Encounter (Signed)
Please let Jenn know to expedite referral. Also does she need something for pain?

## 2011-06-09 NOTE — Telephone Encounter (Signed)
Pt states she will take some tylenol. Britt Boozer is  Working on referral.

## 2011-07-01 ENCOUNTER — Ambulatory Visit (INDEPENDENT_AMBULATORY_CARE_PROVIDER_SITE_OTHER): Payer: Medicare Other | Admitting: Surgery

## 2011-07-14 ENCOUNTER — Encounter: Payer: Self-pay | Admitting: *Deleted

## 2011-07-21 ENCOUNTER — Ambulatory Visit (INDEPENDENT_AMBULATORY_CARE_PROVIDER_SITE_OTHER): Payer: Medicare Other | Admitting: Family Medicine

## 2011-07-21 ENCOUNTER — Encounter: Payer: Self-pay | Admitting: Family Medicine

## 2011-07-21 VITALS — BP 115/71 | HR 88 | Ht 59.0 in | Wt 137.0 lb

## 2011-07-21 DIAGNOSIS — H539 Unspecified visual disturbance: Secondary | ICD-10-CM

## 2011-07-21 DIAGNOSIS — E559 Vitamin D deficiency, unspecified: Secondary | ICD-10-CM

## 2011-07-21 DIAGNOSIS — I10 Essential (primary) hypertension: Secondary | ICD-10-CM

## 2011-07-21 MED ORDER — AMLODIPINE BESY-BENAZEPRIL HCL 5-20 MG PO CAPS: 1.0000 | ORAL_CAPSULE | Freq: Every day | ORAL | Status: DC

## 2011-07-21 NOTE — Patient Instructions (Addendum)
Please go to eye exam soon Call if BPs are low.

## 2011-07-21 NOTE — Progress Notes (Signed)
  Subjective:    Patient ID: Shelly Page, female    DOB: 06/29/1932, 78 y.o.   MRN: [id number]  HPI HTN - No CP or SOB. Taking meds regularly. occ feels tired in the afternoon. Noticed a blind spot in her vision at times with reading. Last eye check was 2 years ago when had her cataract surgery.    Vit D def - Not on her supplement. She has not been very consistent with this.  We discussed getting her shingles vaccine her last office visit. She says she still has the prescription at home. She says she plans to look into this summer.   Review of Systems     Objective:   Physical Exam  Constitutional: She is oriented to person, place, and time. She appears well-developed and well-nourished.  HENT:  Head: Normocephalic and atraumatic.  Cardiovascular: Normal rate, regular rhythm and normal heart sounds.   Pulmonary/Chest: Effort normal and breath sounds normal.  Musculoskeletal: She exhibits no edema.  Neurological: She is alert and oriented to person, place, and time.  Skin: Skin is warm and dry.  Psychiatric: She has a normal mood and affect. Her behavior is normal.          Assessment & Plan:  HTN-Well controlled.  Labs are uptodate.    Spot in vision - encouraged her to get in and get an eye exam. Could be coming from low or high BP as well. Encouraged her to check her BP next time this happens.   Vit D def- Restart supplement   Reminded to get your shingles vaccine.   Depression screening-PHQ-9 score of 1, neg for depression   Fall risk assessment-score of 2, low risk for falls.

## 2011-11-10 ENCOUNTER — Other Ambulatory Visit: Payer: Self-pay | Admitting: Family Medicine

## 2011-12-09 ENCOUNTER — Ambulatory Visit (INDEPENDENT_AMBULATORY_CARE_PROVIDER_SITE_OTHER): Payer: Medicare Other | Admitting: Family Medicine

## 2011-12-09 DIAGNOSIS — Z23 Encounter for immunization: Secondary | ICD-10-CM

## 2011-12-09 NOTE — Progress Notes (Signed)
Patient ID: Shelly Page, female   DOB: 30-Sep-1932, 78 y.o.   MRN: [id number] Here for flu shot

## 2012-01-23 ENCOUNTER — Ambulatory Visit (INDEPENDENT_AMBULATORY_CARE_PROVIDER_SITE_OTHER): Payer: Medicare Other | Admitting: Family Medicine

## 2012-01-23 ENCOUNTER — Encounter: Payer: Self-pay | Admitting: Family Medicine

## 2012-01-23 VITALS — BP 121/76 | HR 89 | Ht 59.0 in | Wt 136.0 lb

## 2012-01-23 DIAGNOSIS — I10 Essential (primary) hypertension: Secondary | ICD-10-CM

## 2012-01-23 DIAGNOSIS — R5383 Other fatigue: Secondary | ICD-10-CM

## 2012-01-23 DIAGNOSIS — Z23 Encounter for immunization: Secondary | ICD-10-CM

## 2012-01-23 DIAGNOSIS — G4700 Insomnia, unspecified: Secondary | ICD-10-CM

## 2012-01-23 DIAGNOSIS — Z2911 Encounter for prophylactic immunotherapy for respiratory syncytial virus (RSV): Secondary | ICD-10-CM

## 2012-01-23 DIAGNOSIS — R5381 Other malaise: Secondary | ICD-10-CM

## 2012-01-23 MED ORDER — AMLODIPINE BESY-BENAZEPRIL HCL 5-20 MG PO CAPS: 1.0000 | ORAL_CAPSULE | Freq: Every day | ORAL | Status: DC

## 2012-01-23 NOTE — Addendum Note (Signed)
Addended by: Judie Petit A on: 01/23/2012 01:35 PM   Modules accepted: Orders

## 2012-01-23 NOTE — Patient Instructions (Addendum)
Check your insurance to see if they will cover Shingles Vaccine.

## 2012-01-23 NOTE — Progress Notes (Signed)
  Subjective:    Patient ID: Shelly Page, female    DOB: Jul 02, 1932, 78 y.o.   MRN: [id number]  HPI Insomnia- Says the furnance kicks on and off all night and that keeps her awake.  Hasn't tried taking aything. Thinking about trying Tylenol PM. No worsening or allevaiting sxs.   HTN -  Pt denies chest pain, SOB, dizziness, or heart palpitations.  Taking meds as directed w/o problems.  Denies medication side effects.   She has also felt more fatigued lately. Though, she says she thinks it may be because of the winter time. Also her knees been bothering her so she's been less active. No worsening or alleviating symptoms.     Review of Systems     Objective:   Physical Exam  Constitutional: She is oriented to person, place, and time. She appears well-developed and well-nourished.  HENT:  Head: Normocephalic and atraumatic.  Neck: Neck supple. No thyromegaly present.  Cardiovascular: Normal rate, regular rhythm and normal heart sounds.   Pulmonary/Chest: Effort normal and breath sounds normal.  Lymphadenopathy:    She has no cervical adenopathy.  Neurological: She is alert and oriented to person, place, and time.  Skin: Skin is warm and dry.  Psychiatric: She has a normal mood and affect. Her behavior is normal.          Assessment & Plan:  Insomnia- Will try tylenol PM and if not wokring will call back and we can try trazodone.  She will call if not working well.   HTN - Well controlled.  F/U in 6 months . Check CMP and lipids.   Shingles vaccine given today.    Fatigue-discussed that we will check her thyroid as well as a CBC. We'll also check a CMP. Suspect it may be seasonal affective disorder. Consider further workup and evaluation if her symptoms continue.

## 2012-02-16 LAB — COMPLETE METABOLIC PANEL WITH GFR
ALT: 14 U/L (ref 0–35)
BUN: 14 mg/dL (ref 6–23)
CO2: 27 mEq/L (ref 19–32)
Calcium: 9.9 mg/dL (ref 8.4–10.5)
Chloride: 102 mEq/L (ref 96–112)
Creat: 0.55 mg/dL (ref 0.50–1.10)
GFR, Est African American: >89 mL/min

## 2012-02-16 LAB — CBC WITH DIFFERENTIAL/PLATELET
Basophils Absolute: 0.1 10*3/uL (ref 0.0–0.1)
Basophils Relative: 1 % (ref 0–1)
Eosinophils Absolute: 0.1 10*3/uL (ref 0.0–0.7)
Lymphs Abs: 2.7 10*3/uL (ref 0.7–4.0)
MCH: 29.7 pg (ref 26.0–34.0)
MCHC: 35.2 g/dL (ref 30.0–36.0)
Neutrophils Relative %: 56 % (ref 43–77)
Platelets: 304 10*3/uL (ref 150–400)
RBC: 5.11 MIL/uL (ref 3.87–5.11)
RDW: 14.3 % (ref 11.5–15.5)

## 2012-02-16 LAB — LIPID PANEL
Cholesterol: 213 mg/dL — ABNORMAL HIGH (ref 0–200)
LDL Cholesterol: 134 mg/dL — ABNORMAL HIGH (ref 0–99)
Triglycerides: 114 mg/dL (ref <150)

## 2012-02-16 LAB — TSH: TSH: 1.378 u[IU]/mL (ref 0.350–4.500)

## 2012-02-17 ENCOUNTER — Telehealth: Payer: Self-pay | Admitting: *Deleted

## 2012-02-17 NOTE — ED Notes (Addendum)
Patient informed of lab results, no further questions.

## 2012-02-17 NOTE — Telephone Encounter (Signed)
Message copied by Edilia Bo on Fri Feb 17, 2012 11:08 AM ------      Message from: Nani Gasser D      Created: Thu Feb 16, 2012  9:51 PM       Call pt" Labs look ok.

## 2012-02-23 ENCOUNTER — Encounter: Payer: Self-pay | Admitting: Family Medicine

## 2012-02-23 ENCOUNTER — Ambulatory Visit (INDEPENDENT_AMBULATORY_CARE_PROVIDER_SITE_OTHER): Payer: Medicare Other | Admitting: Family Medicine

## 2012-02-23 VITALS — BP 136/68 | HR 86 | Resp 16 | Wt 137.0 lb

## 2012-02-23 DIAGNOSIS — F29 Unspecified psychosis not due to a substance or known physiological condition: Secondary | ICD-10-CM

## 2012-02-23 DIAGNOSIS — H547 Unspecified visual loss: Secondary | ICD-10-CM

## 2012-02-23 DIAGNOSIS — R4182 Altered mental status, unspecified: Secondary | ICD-10-CM

## 2012-02-23 DIAGNOSIS — R410 Disorientation, unspecified: Secondary | ICD-10-CM

## 2012-02-23 NOTE — Progress Notes (Signed)
Subjective:    Patient ID: Shelly Page, female    DOB: [birth year], 79 y.o.   MRN: [id number]  HPI 2days ago was at a funeral and had a HA for a couple of minutes but then went away. Then about 3 hours latera couldn't see the right side of the TV screen (right sided field vision loss).  This lasted about 3 minutes Then looked at her cell phone and couldn't remember what to call it.That lasted about 20 minutes.    Had a simliar episode a couple of years ago.  No weakness afterwards. Not happened again. Hx of migraine with aura.  Hx of HTN.  Never smoked.  No fam hx of stroke.    Lab Results  Component Value Date   CHOL 213* 02/16/2012   HDL 56 02/16/2012   LDLCALC 308* 02/16/2012   TRIG 114 02/16/2012   CHOLHDL 3.8 02/16/2012     Review of Systems     BP 136/68  Pulse 86  Resp 16  Wt 137 lb (62.143 kg)  SpO2 98%    Allergies  Allergen Reactions  . Calcitonin ([place]mon)     REACTION: headaches  . Cephalexin   . Amoxicillin Rash    Past Medical History  Diagnosis Date  . Allergy   . H. pylori infection     history  . Sleep apnea     mild/no mask prescribed  . Hypertension   . DJD (degenerative joint disease)     knee  . NSVD (normal spontaneous vaginal delivery)     Past Surgical History  Procedure Date  . Total hip arthroplasty     right  . Tubal ligation   . Cataract surgery     right and left    History   Social History  . Marital Status: Married    Spouse Name: N/A    Number of Children: N/A  . Years of Education: N/A   Occupational History  . Not on file.   Social History Main Topics  . Smoking status: Never Smoker   . Smokeless tobacco: Not on file  . Alcohol Use: No  . Drug Use: No  . Sexually Active:      Comment: retired [employer], married 40 yrs, 2 children, low impact aerobics   Other Topics Concern  . Not on file   Social History Narrative  . No narrative on file    Family History  Problem Relation Age of  Onset  . Alcohol abuse Brother   . Hip fracture Mother     died/osteoporosis    Outpatient Encounter Prescriptions as of 02/23/2012  Medication Sig Dispense Refill  . amLODipine-benazepril (LOTREL) 5-20 MG per capsule Take 1 capsule by mouth daily.  90 capsule  1  . Calcium Carbonate (CALCARB 600 PO) Take 125 mg by mouth 2 (two) times daily with a meal.        . cholecalciferol (VITAMIN D) 1000 UNITS tablet Take 1,000 Units by mouth daily.      . metroNIDAZOLE (METROGEL) 0.75 % gel Apply topically 2 (two) times daily.      . Multiple Vitamin (MULTIVITAMIN) tablet Take 1 tablet by mouth daily.        . ranitidine (ZANTAC) 150 MG capsule Take 150 mg by mouth daily.             Objective:   Physical Exam  Constitutional: She is oriented to person, place, and time. She appears well-developed and  well-nourished.  HENT:  Head: Normocephalic and atraumatic.  Right Ear: External ear normal.  Left Ear: External ear normal.  Nose: Nose normal.  Mouth/Throat: Oropharynx is clear and moist.       TMs and canals are clear.   Eyes: Conjunctivae normal and EOM are normal. Pupils are equal, round, and reactive to light.  Neck: Neck supple. No thyromegaly present.  Cardiovascular: Normal rate, regular rhythm and normal heart sounds.   Pulmonary/Chest: Effort normal and breath sounds normal. She has no wheezes.  Lymphadenopathy:    She has no cervical adenopathy.  Neurological: She is alert and oriented to person, place, and time. She has normal reflexes. She displays normal reflexes. No cranial nerve deficit. She exhibits normal muscle tone. Coordination normal.       Alert and oriented.  CN 2-12 intact.  Neg rhomberg. Normal rapid alternating movements of hands. Reflexes symmetric in the UE and LE.  Normal knee to ankle, down the shin bilaterally.  No tremor.     Skin: Skin is warm and dry.  Psychiatric: She has a normal mood and affect. Her behavior is normal.          Assessment &  Plan:  Altered mental status-this episode is very concerning for mini stroke. I would like to schedule her for an MRI of the head. Right now I have no other explanations for her symptoms. Denies headache, nausea, vomiting, speech problem or ambulation problem after the episode. I encouraged her to start a baby aspirin daily. We'll see to make sure blood pressures well controlled which it is today. I would like to recheck her lipids to make sure there well-controlled. If she has any recurrence of symptoms then she is to emergency department immediately. Her husband is with her today and encouraged him to make sure that she does go to the ED. Consider, could be a migraine variant as well.

## 2012-02-24 ENCOUNTER — Telehealth: Payer: Self-pay | Admitting: Family Medicine

## 2012-02-24 NOTE — Telephone Encounter (Signed)
Shelly Page is ok with the start of the cholesterol medication and Asprin. She would like it sent to [facility].

## 2012-02-24 NOTE — Telephone Encounter (Signed)
Please call patient and let her know that her MRI results back. There is no acute abnormality. No acute stroke or mass. There is a progression of increased microvascular ischemic disease. This is like hardening of the arteries but of the tiny blood vessels in the brain. This will put her at high risk of having a stroke. 2 reduce the progression of this she really needs to be started on a cholesterol medication. And a baby aspirin. If she's okay with starting a cholesterol pill then please let me know and I will send it over to the pharmacy. It is the only thing that will help prevent progression of hardening of his arteries. If she has another episode similar to what she had before then please let me know immediately and or go to the emergency room.

## 2012-02-26 MED ORDER — PRAVASTATIN SODIUM 40 MG PO TABS: 40.0000 mg | ORAL_TABLET | Freq: Every day | ORAL | Status: AC

## 2012-02-26 NOTE — Telephone Encounter (Signed)
rx sent

## 2012-07-03 IMAGING — US US PELVIS LIMITED
1 series · 6 of 6 positions shown · non-contrast
Comparison: None.

CLINICAL DATA: Palpable mass in right anterior pelvis.

LIMITED ULTRASOUND OF PELVIC SOFT TISSUES
TECHNIQUE: Ultrasound examination of the pelvic soft tissues was
performed in the area of clinical concern.

[Series 1: us pelvis limited · 0.05mm/px · 6 acquisitions, 6 frames shown]
[im 1/6]
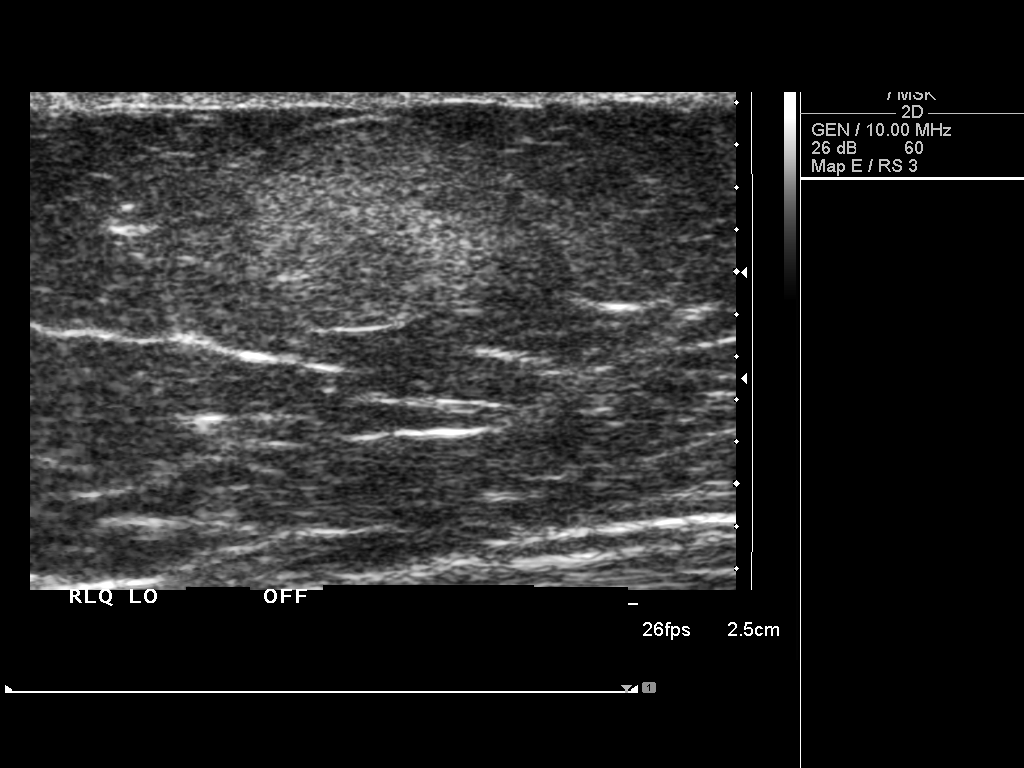
[im 2/6]
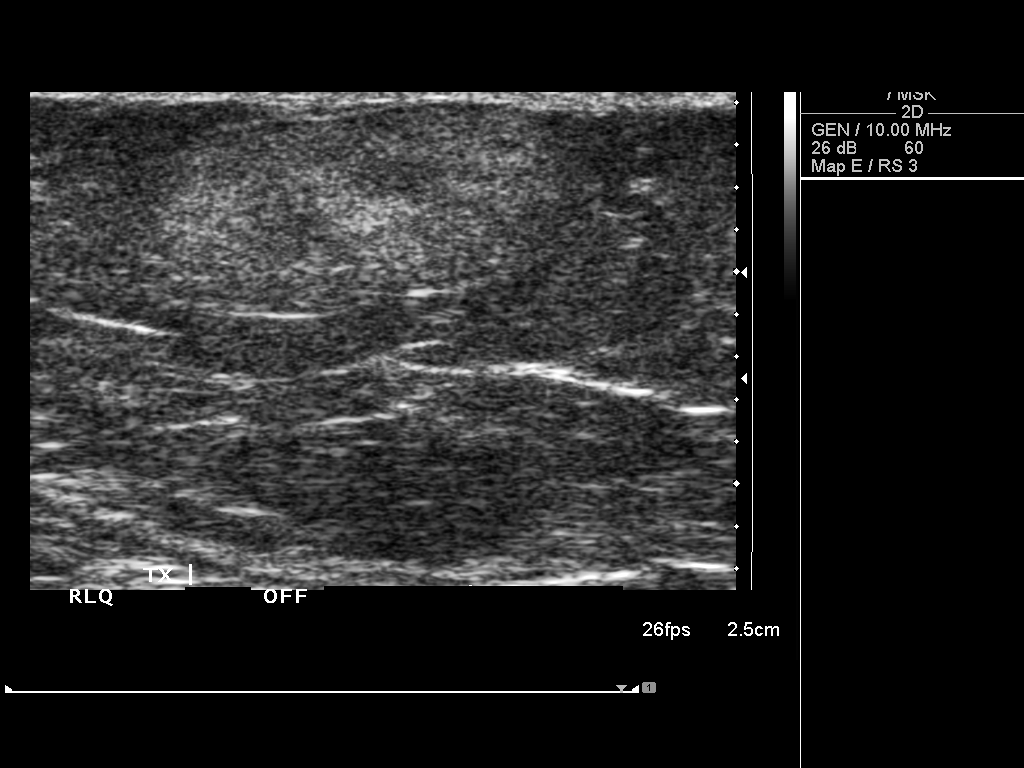
[im 3/6]
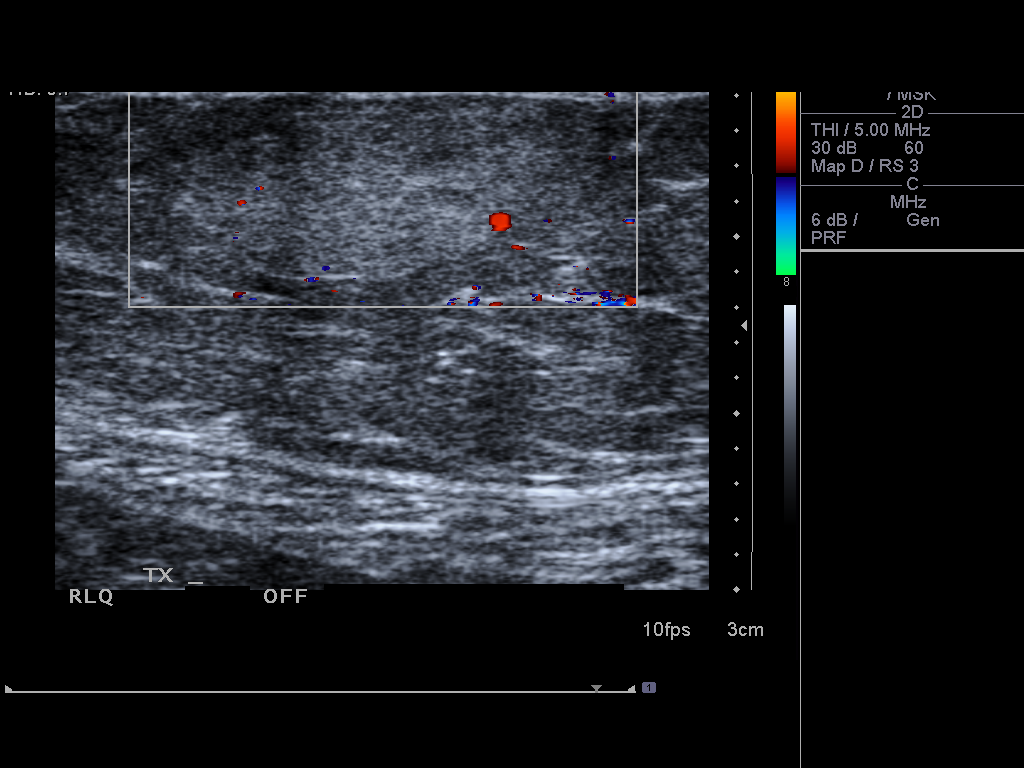
[im 4/6]
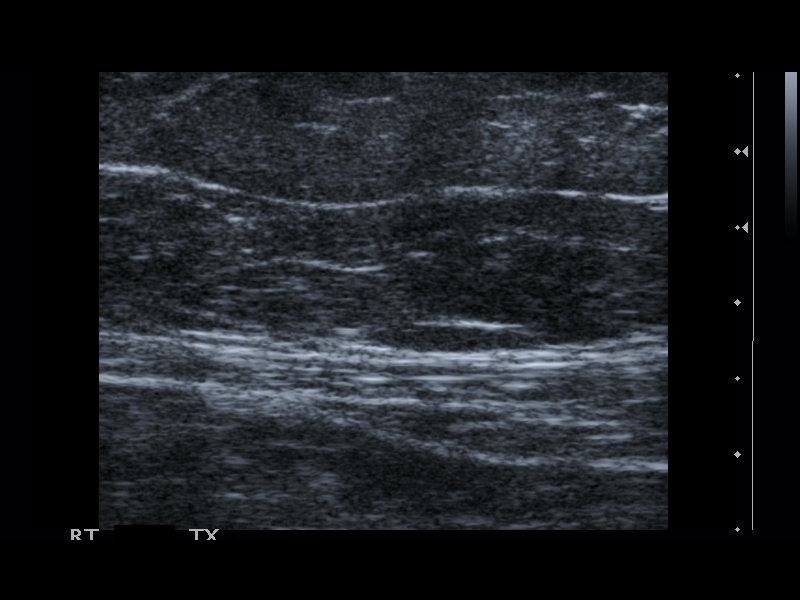
[im 5/6]
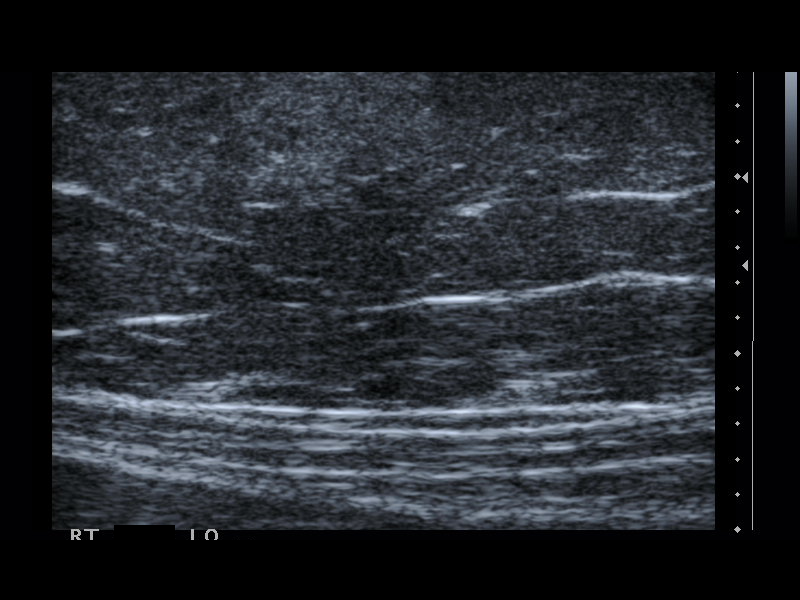
[im 6/6]
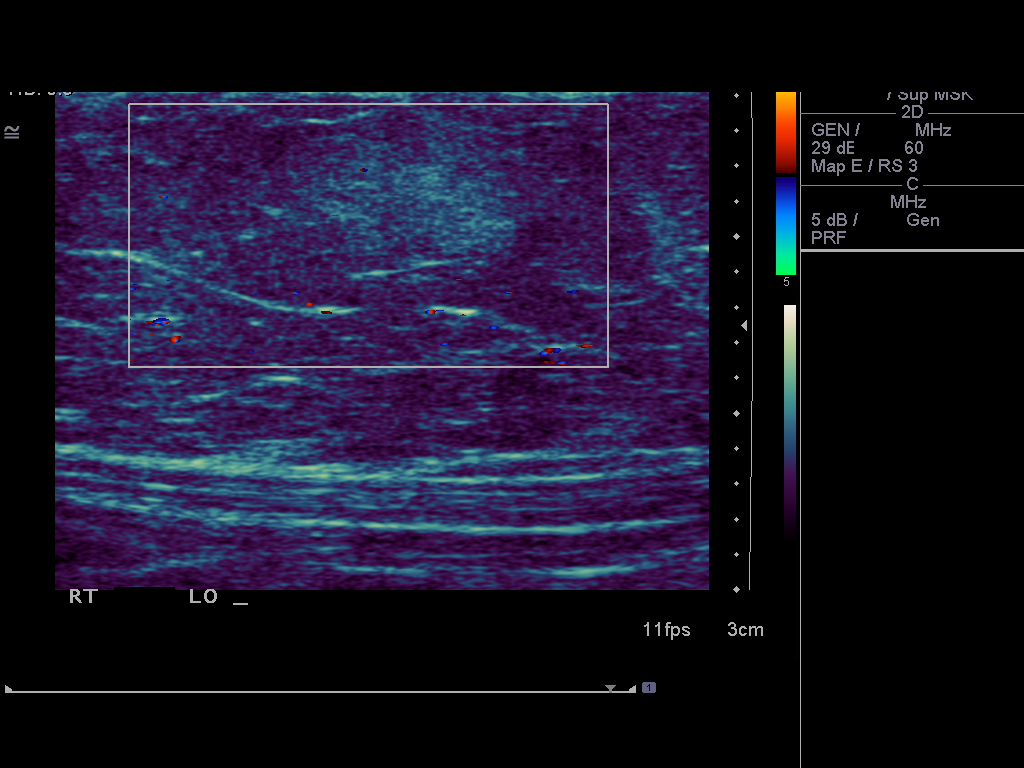

[6 of 6 positions shown; findings below may reference images not displayed]

FINDINGS: In the right anterior pelvic wall subcutaneous fat, there
is a well-circumscribed mildly heterogeneous hyperechoic mass which
measures approximately 3.0 x 1.2 by 1.5 cm.  This appears solid,
and shows a small amount of internal blood flow. Its mildly
hyperechoic echotexture is suggestive of a lipoma, although
ultrasound is not tissue specific.

No hernia identified in this region.  No abnormal fluid collections
are seen.
IMPRESSION: 3.0 cm solid mass in the subcutaneous tissues of the right anterior
pelvic wall, which corresponds to the palpable abnormality.  This
may represent a lipoma, however ultrasound is not tissue-specific.
Recommend correlation with physical exam findings;  further
characterization with MRI, biopsy, or excision should be considered
if there is felt to be growth or other suspicious clinical
features.

## 2012-07-07 ENCOUNTER — Other Ambulatory Visit: Payer: Self-pay | Admitting: Family Medicine

## 2012-07-23 ENCOUNTER — Ambulatory Visit: Payer: Medicare Other | Admitting: Family Medicine

## 2012-08-07 ENCOUNTER — Ambulatory Visit: Payer: Medicare Other | Admitting: Family Medicine

## 2012-12-13 ENCOUNTER — Other Ambulatory Visit: Payer: Self-pay

## 2014-08-04 ENCOUNTER — Other Ambulatory Visit: Payer: Self-pay

## 2016-06-10 ENCOUNTER — Encounter: Payer: Self-pay | Admitting: Emergency Medicine

## 2016-06-10 ENCOUNTER — Emergency Department (INDEPENDENT_AMBULATORY_CARE_PROVIDER_SITE_OTHER)
Admission: EM | Admit: 2016-06-10 | Discharge: 2016-06-10 | Disposition: A | Discharge status: Home / Self Care | Payer: Medicare Other | Source: Home / Self Care | Attending: Family Medicine | Admitting: Family Medicine

## 2016-06-10 DIAGNOSIS — J069 Acute upper respiratory infection, unspecified: Secondary | ICD-10-CM | POA: Diagnosis not present

## 2016-06-10 DIAGNOSIS — B9789 Other viral agents as the cause of diseases classified elsewhere: Secondary | ICD-10-CM | POA: Diagnosis not present

## 2016-06-10 MED ORDER — AZITHROMYCIN 250 MG PO TABS: 250.0000 mg | ORAL_TABLET | Freq: Every day | ORAL | 0 refills | Status: AC

## 2016-06-10 NOTE — ED Triage Notes (Signed)
Sore throat, dry cough, congestion x 5 days

## 2016-06-10 NOTE — Discharge Instructions (Signed)
°  Your symptoms are likely due to a virus such as the common cold, however, if you developing worsening chest congestion with shortness of breath, persistent fever (>100.4*) for 3 days, or symptoms not improving in 4-5 days, you may fill the antibiotic (azithromycin).  If you do fill the antibiotic,  please take antibiotics as prescribed and be sure to complete entire course even if you start to feel better to ensure infection does not come back.

## 2016-06-10 NOTE — ED Provider Notes (Signed)
CSN: [id number]     Arrival date & time 06/10/16  1136 History   First MD Initiated Contact with Patient 06/10/16 1215     Chief Complaint  Patient presents with  . Sore Throat   (Consider location/radiation/quality/duration/timing/severity/associated sxs/prior Treatment) HPI  Shelly Page is a 83 y.o. female presenting to UC with c/o sore throat, dry cough, and post-nasal drip for 5 days. Denies fever, chills, n/v/d. She has tried OTC nasal saline and cough drops with mild relief. Denies known sick contacts. Denies chest pain or SOB. She is on warfarin due to hx of TIAs.   Past Medical History:  Diagnosis Date  . Allergy   . DJD (degenerative joint disease)    knee  . H. pylori infection    history  . Hypertension   . NSVD (normal spontaneous vaginal delivery)   . Sleep apnea    mild/no mask prescribed   Past Surgical History:  Procedure Laterality Date  . cataract surgery     right and left  . TOTAL HIP ARTHROPLASTY     right  . TUBAL LIGATION     Family History  Problem Relation Age of Onset  . Hip fracture Mother     died/osteoporosis  . Alcohol abuse Brother    Social History  Substance Use Topics  . Smoking status: Never Smoker  . Smokeless tobacco: Never Used  . Alcohol use No   OB History    No data available     Review of Systems  Constitutional: Negative for chills and fever.  HENT: Positive for congestion, postnasal drip and sore throat. Negative for ear pain, trouble swallowing and voice change.   Respiratory: Positive for cough. Negative for shortness of breath.   Cardiovascular: Negative for chest pain and palpitations.  Gastrointestinal: Negative for abdominal pain, diarrhea, nausea and vomiting.  Musculoskeletal: Negative for arthralgias, back pain and myalgias.  Skin: Negative for rash.    Allergies  Calcitonin (salmon); Cephalexin; and Amoxicillin  Home Medications   Prior to Admission medications   Medication Sig Start Date End  Date Taking? Authorizing Provider  amLODipine-benazepril (LOTREL) 5-20 MG per capsule TAKE ONE CAPSULE BY MOUTH EVERY DAY 07/07/12   Hali Marry, MD  azithromycin (ZITHROMAX) 250 MG tablet Take 1 tablet (250 mg total) by mouth daily. Take first 2 tablets together, then 1 every day until finished. 06/10/16   Noland Fordyce, PA-C  Calcium Carbonate (CALCARB 600 PO) Take 125 mg by mouth 2 (two) times daily with a meal.      Historical Provider, MD  cholecalciferol (VITAMIN D) 1000 UNITS tablet Take 1,000 Units by mouth daily.    Historical Provider, MD  metroNIDAZOLE (METROGEL) 0.75 % gel Apply topically 2 (two) times daily.    Historical Provider, MD  Multiple Vitamin (MULTIVITAMIN) tablet Take 1 tablet by mouth daily.      Historical Provider, MD  pravastatin (PRAVACHOL) 40 MG tablet Take 1 tablet (40 mg total) by mouth at bedtime. 02/26/12   Hali Marry, MD  ranitidine (ZANTAC) 150 MG capsule Take 150 mg by mouth daily.      Historical Provider, MD   Meds Ordered and Administered this Visit  Medications - No data to display  BP 135/77 (BP Location: Left Arm)   Pulse 78   Temp 98.3 F (36.8 C) (Oral)   Ht 4\' 10"  (1.473 m)   Wt 126 lb (57.2 kg)   SpO2 96%   BMI 26.33 kg/m  No data found.  Physical Exam  Constitutional: She is oriented to person, place, and time. She appears well-developed and well-nourished. No distress.  HENT:  Head: Normocephalic and atraumatic.  Right Ear: Tympanic membrane normal.  Left Ear: Tympanic membrane normal.  Nose: Nose normal.  Mouth/Throat: Uvula is midline and mucous membranes are normal. Posterior oropharyngeal erythema present. No oropharyngeal exudate, posterior oropharyngeal edema or tonsillar abscesses.  Eyes: EOM are normal.  Neck: Normal range of motion. Neck supple.  Cardiovascular: Normal rate and regular rhythm.   Pulmonary/Chest: Effort normal and breath sounds normal. No stridor. No respiratory distress. She has no wheezes.  She has no rales.  Musculoskeletal: Normal range of motion.  Lymphadenopathy:    She has no cervical adenopathy.  Neurological: She is alert and oriented to person, place, and time.  Skin: Skin is warm and dry. She is not diaphoretic.  Psychiatric: She has a normal mood and affect. Her behavior is normal.  Nursing note and vitals reviewed.   Urgent Care Course     Procedures (including critical care time)  Labs Review Labs Reviewed - No data to display  Imaging Review No results found.   MDM   1. Viral URI with cough    Symptoms likely viral. Encouraged symptomatic treatment.  Prescription to hold with expiration date for azithromycin. Pt to fill if persistent fever develops or not improving in 1 week.      Noland Fordyce, PA-C 06/10/16 1329
